# Patient Record
Sex: Male | Born: 1935 | Race: Black or African American | Hispanic: No | Marital: Married | Smoking: Former smoker
Health system: Southern US, Community
[De-identification: ages and names within clinical notes are randomized; demographics above are authoritative.]

## PROBLEM LIST (undated history)

## (undated) DIAGNOSIS — E039 Hypothyroidism, unspecified: Secondary | ICD-10-CM

## (undated) DIAGNOSIS — E785 Hyperlipidemia, unspecified: Secondary | ICD-10-CM

## (undated) HISTORY — DX: Hypothyroidism, unspecified: E03.9

## (undated) HISTORY — DX: Hyperlipidemia, unspecified: E78.5

---

## 1997-08-13 ENCOUNTER — Other Ambulatory Visit: Admission: RE | Admit: 1997-08-13 | Discharge: 1997-08-13 | Payer: Self-pay | Admitting: *Deleted

## 2002-10-31 ENCOUNTER — Encounter (HOSPITAL_BASED_OUTPATIENT_CLINIC_OR_DEPARTMENT_OTHER): Payer: Self-pay | Admitting: General Surgery

## 2002-11-05 ENCOUNTER — Ambulatory Visit (HOSPITAL_COMMUNITY): Admission: RE | Admit: 2002-11-05 | Discharge: 2002-11-05 | Payer: Self-pay | Admitting: General Surgery

## 2005-03-10 ENCOUNTER — Ambulatory Visit (HOSPITAL_COMMUNITY): Admission: RE | Admit: 2005-03-10 | Discharge: 2005-03-10 | Payer: Self-pay | Admitting: Gastroenterology

## 2005-03-10 ENCOUNTER — Encounter (INDEPENDENT_AMBULATORY_CARE_PROVIDER_SITE_OTHER): Payer: Self-pay | Admitting: *Deleted

## 2007-03-29 ENCOUNTER — Encounter (INDEPENDENT_AMBULATORY_CARE_PROVIDER_SITE_OTHER): Payer: Self-pay | Admitting: Urology

## 2007-03-29 ENCOUNTER — Ambulatory Visit (HOSPITAL_BASED_OUTPATIENT_CLINIC_OR_DEPARTMENT_OTHER): Admission: RE | Admit: 2007-03-29 | Discharge: 2007-03-29 | Payer: Self-pay | Admitting: Urology

## 2010-06-07 NOTE — Op Note (Signed)
NAME:  Samuel [name], Samuel [name] NO.:  [id number]   MEDICAL RECORD NO.:  [id number]          PATIENT TYPE:  AMB   LOCATION:  [facility]                         FACILITY:  [facility]   PHYSICIAN:  Lindaann Slough, M.D.  DATE OF BIRTH:  27-Feb-1935   DATE OF PROCEDURE:  03/29/2007  DATE OF DISCHARGE:                               OPERATIVE REPORT   PREOPERATIVE DIAGNOSIS:  Elevated PSA (prostate specific antigen), BPH  (benign prostatic hyperplasia).   POSTOPERATIVE DIAGNOSIS:  Elevated PSA (prostate specific antigen), BPH  (benign prostatic hyperplasia).   PROCEDURE:  Saturation prostate biopsy.   SURGEON:  Danae Chen, M.D.   ULTRASOUND TECHNOLOGIST:  Lanny Hurst.   ANESTHESIA:  General.   INDICATION:  Had three negative biopsies in the past.  His PSA has  continued to rise.  I discussed with the patient regarding doing a  saturation biopsy and he agreed to have it done.  He is scheduled today  for the procedure.   PROCEDURE IN DETAIL:  Under general anesthesia the patient was prepped  and draped and placed in the dorsal lithotomy position.  The patient was  identified by his wristband and a time-out was done.  Then the  transducer was inserted in the rectum and the grid was attached to the  transducer then 24 biopsies were done throughout the prostate gland  covering all the quadrants.  There was minimal bleeding.  The transducer  was then removed.   The patient tolerated the procedure well and left the OR in satisfactory  condition to post anesthesia care unit.      Lindaann Slough, M.D.  Electronically Signed     MN/MEDQ  D:  03/29/2007  T:  03/31/2007  Job:  [id number]

## 2010-06-10 NOTE — Op Note (Signed)
Samuel [name], Samuel [name] NO.:  [id number]   MEDICAL RECORD NO.:  [id number]                   PATIENT TYPE:  OIB   LOCATION:  2890                                 FACILITY:  [facility]   PHYSICIAN:  Leonie Man, M.D.                DATE OF BIRTH:  April 06, 1935   DATE OF PROCEDURE:  11/05/2002  DATE OF DISCHARGE:                                 OPERATIVE REPORT   PREOPERATIVE DIAGNOSIS:  Left inguinal hernia.   POSTOPERATIVE DIAGNOSIS:  Left direct inguinal hernia.   OPERATION PERFORMED:  Left inguinal herniorrhaphy with polypropylene mesh.   SURGEON:  Leonie Man, M.D.   ASSISTANT:  Nurse.   ANESTHESIA:  General.   INDICATIONS FOR PROCEDURE:  The patient is a 75 year old man who presents  with a left groin bulge that has been present for one and a half to two  years.  Recently, the hernia has prolapsed, been prolapsing frequently and  causing some significant discomfort.  He comes to the operating room now for  repair after the risks and potential benefits of surgery had been fully  discussed and all questions answered and consent obtained.   DESCRIPTION OF PROCEDURE:  Following the induction of satisfactory general  anesthesia, the patient was positioned supinely.  The lower abdomen was  prepped and draped to be included in a sterile operative field.  I  infiltrated the lower abdominal crease on the left side with 0.5% Marcaine  with epinephrine 1:200,000.  I then made a transverse incision, deepened  this through the skin and subcutaneous tissue carrying the dissection down  to the external oblique aponeurosis.  Additional injections of Marcaine with  epinephrine were used throughout the course of the operation.  External  oblique aponeurosis was opened up through the external inguinal ring with  protection of the ilioinguinal nerve which was retracted medially and  superiorly.  Spermatic cord was elevated and held with a Penrose drain.  Dissection along the anterior medial aspect of the spermatic cord and  exploration of the cord did not show an indirect hernia.  There was a very  large and direct hernia which was reduced back into the retroperitoneal  space and repaired with a fashioned strip of polypropylene mesh which was  sewn in at the pubic tubercle with a 2-0 Novofil suture and continued up  along the conjoined tendon to the internal ring and again from the pubic  tubercle along the shelving edge of Poupart's ligament to the internal ring.  The mesh was split so as to allow the normal protrusion of the cord.  The  tails of the mesh were then trimmed and sutured into the external oblique  muscle behind the cord.  The repair was checked and noted to be intact.  Sponge, instrument and sharp counts were then verified. The external oblique  aponeurosis was closed over the spermatic cord, thereby reapproximating the  external inguinal ring.  This was done with a 2-0 Vicryl suture.  The  Scarpa's fascia was then closed with a running 3-0 Vicryl suture  and the skin was closed with a running 4-0 Monocryl suture.  The wound was  reinforced with Steri-Strips.  Sterile dressing was applied.  Anesthetic was  reversed and the patient removed from the operating room to the recovery  room in stable condition, having tolerated the procedure well.                                                Leonie Man, M.D.    PB/MEDQ  D:  11/05/2002  T:  11/05/2002  Job:  [id number]

## 2010-10-17 LAB — POCT HEMOGLOBIN-HEMACUE
Hemoglobin: 13.6
Operator id: 268271

## 2012-09-25 ENCOUNTER — Ambulatory Visit
Admission: RE | Admit: 2012-09-25 | Discharge: 2012-09-25 | Disposition: A | Discharge status: Home / Self Care | Payer: BC Managed Care – PPO | Source: Ambulatory Visit | Attending: Nurse Practitioner | Admitting: Nurse Practitioner

## 2012-09-25 ENCOUNTER — Other Ambulatory Visit: Payer: Self-pay | Admitting: Nurse Practitioner

## 2012-09-25 DIAGNOSIS — M545 Low back pain, unspecified: Secondary | ICD-10-CM

## 2012-09-25 DIAGNOSIS — M25551 Pain in right hip: Secondary | ICD-10-CM

## 2014-02-10 IMAGING — CR DG LUMBAR SPINE COMPLETE 4+V
5 series · 5 of 5 positions shown · non-contrast
Comparison: None

CLINICAL DATA: Posterior right hip pain radiating down back of
right leg

LUMBAR SPINE - COMPLETE 4+ VIEW

[view not recorded (1 of 5)]
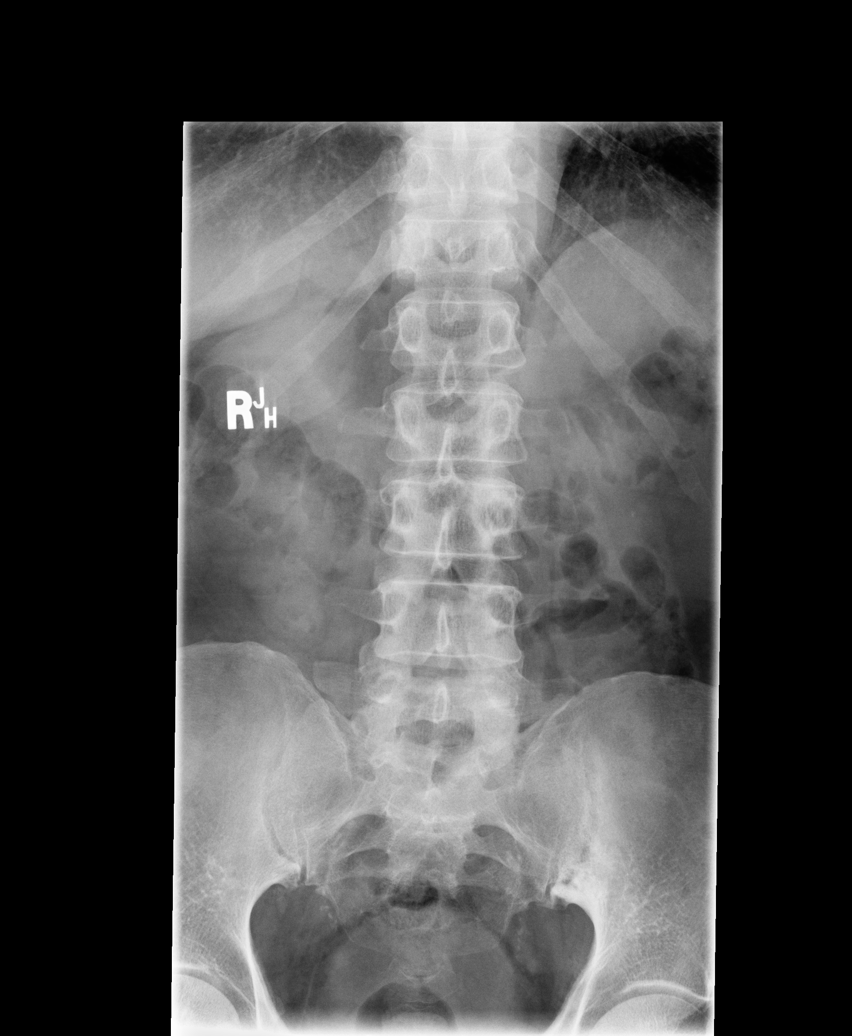

[view not recorded (2 of 5)]
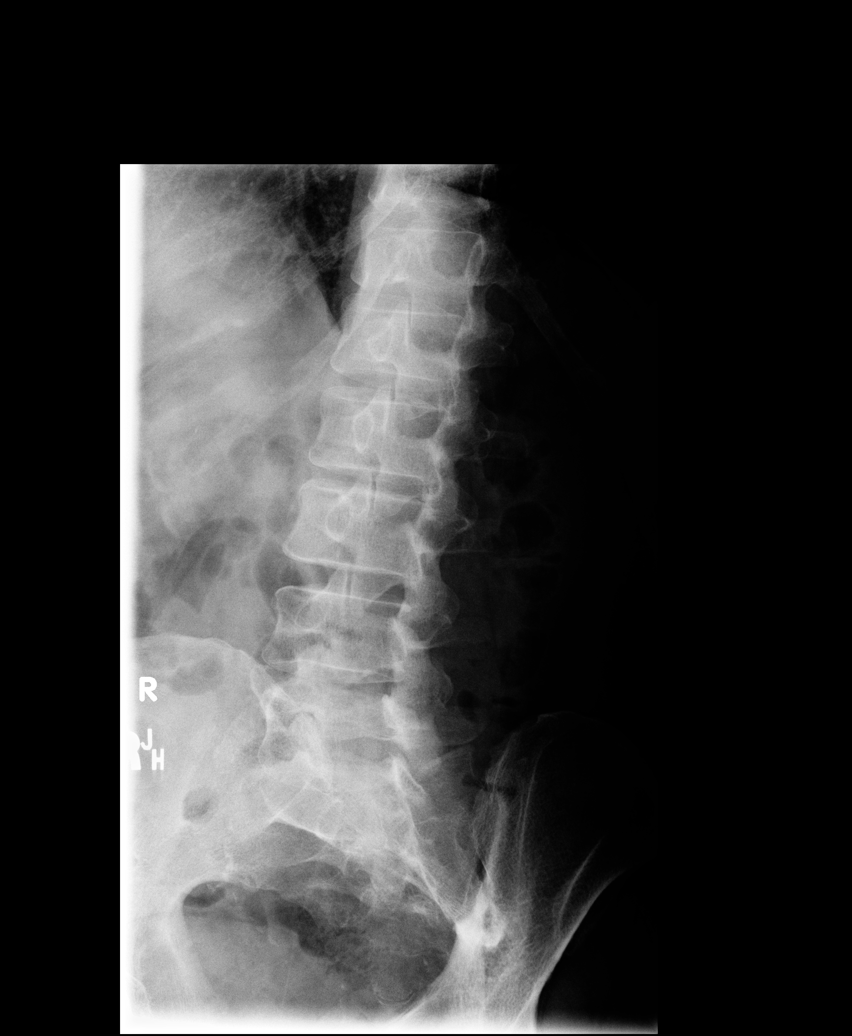

[view not recorded (3 of 5)]
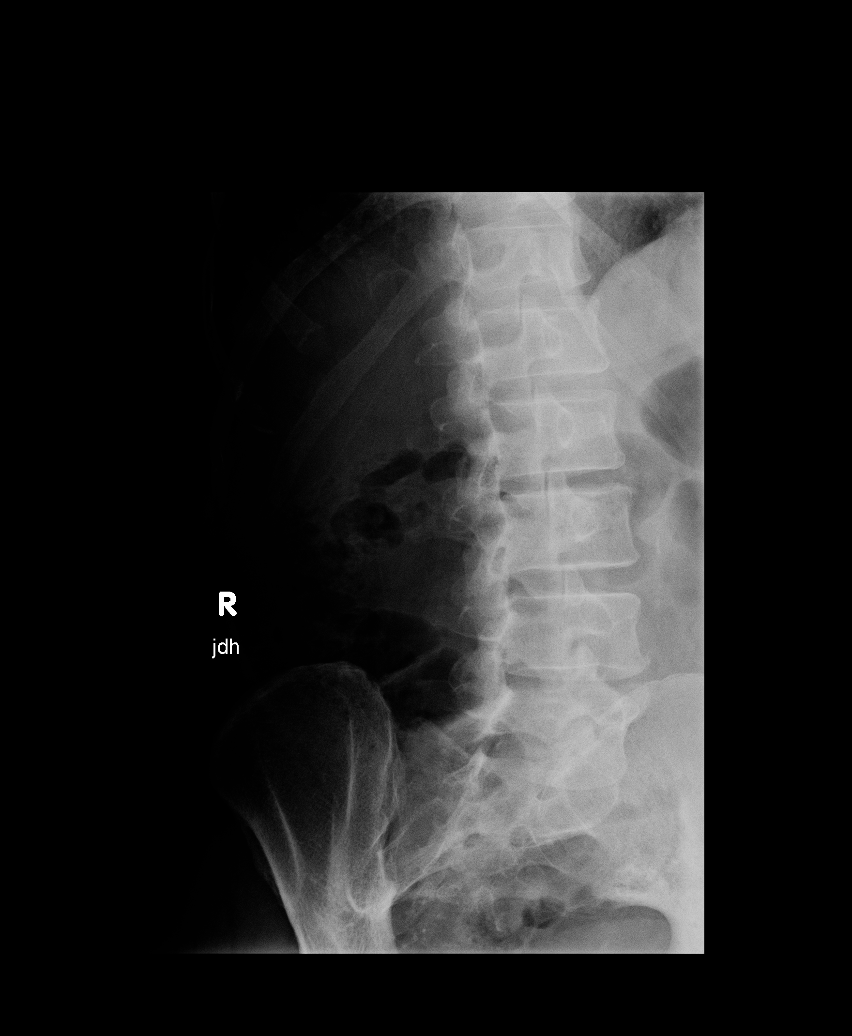

[view not recorded (4 of 5)]
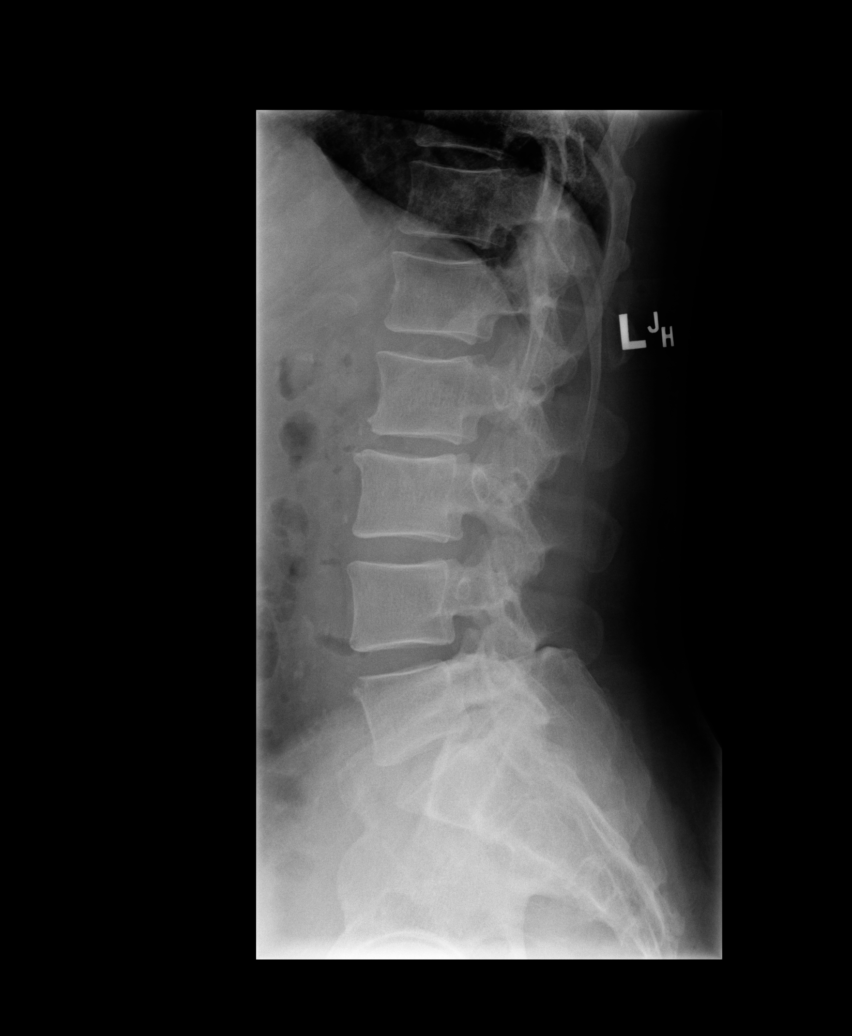

[view not recorded (5 of 5)]
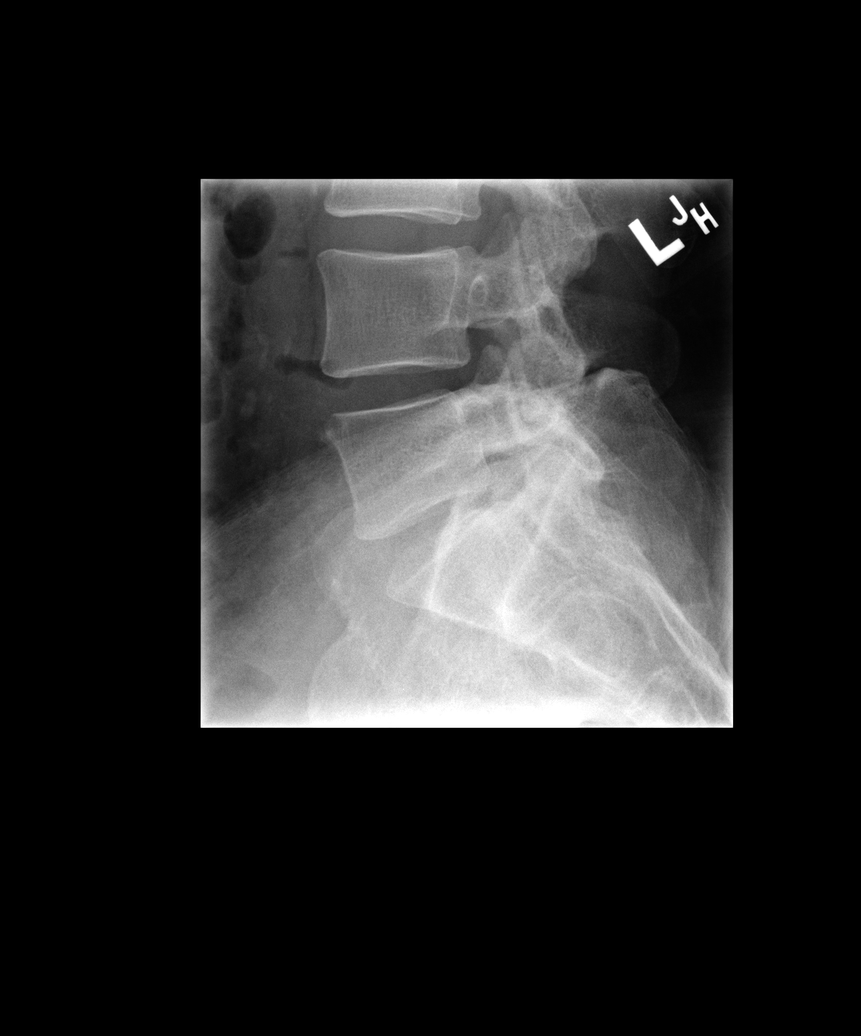

[5 of 5 positions shown; findings below may reference images not displayed]

FINDINGS: Five non-rib bearing lumbar vertebrae.
Osseous mineralization appears mildly decreased.
Slight disc space narrowing at L2-L3.
Vertebral body and disc space heights otherwise maintained.
No acute fracture, subluxation or bone destruction.
No spondylolysis.
IMPRESSION: No acute osseous abnormalities as above.

## 2015-12-27 ENCOUNTER — Ambulatory Visit
Admission: RE | Admit: 2015-12-27 | Discharge: 2015-12-27 | Disposition: A | Discharge status: Home / Self Care | Payer: Federal, State, Local not specified - PPO | Source: Ambulatory Visit | Attending: Nurse Practitioner | Admitting: Nurse Practitioner

## 2015-12-27 ENCOUNTER — Other Ambulatory Visit: Payer: Self-pay | Admitting: Nurse Practitioner

## 2015-12-27 DIAGNOSIS — M25512 Pain in left shoulder: Secondary | ICD-10-CM

## 2017-05-13 IMAGING — CR DG SHOULDER 2+V*L*
3 series · 3 of 3 positions shown · non-contrast
Comparison: None.

CLINICAL DATA: Left shoulder pain for 3 weeks.

EXAM:
LEFT SHOULDER - 2+ VIEW

[w shoulder grashey left]
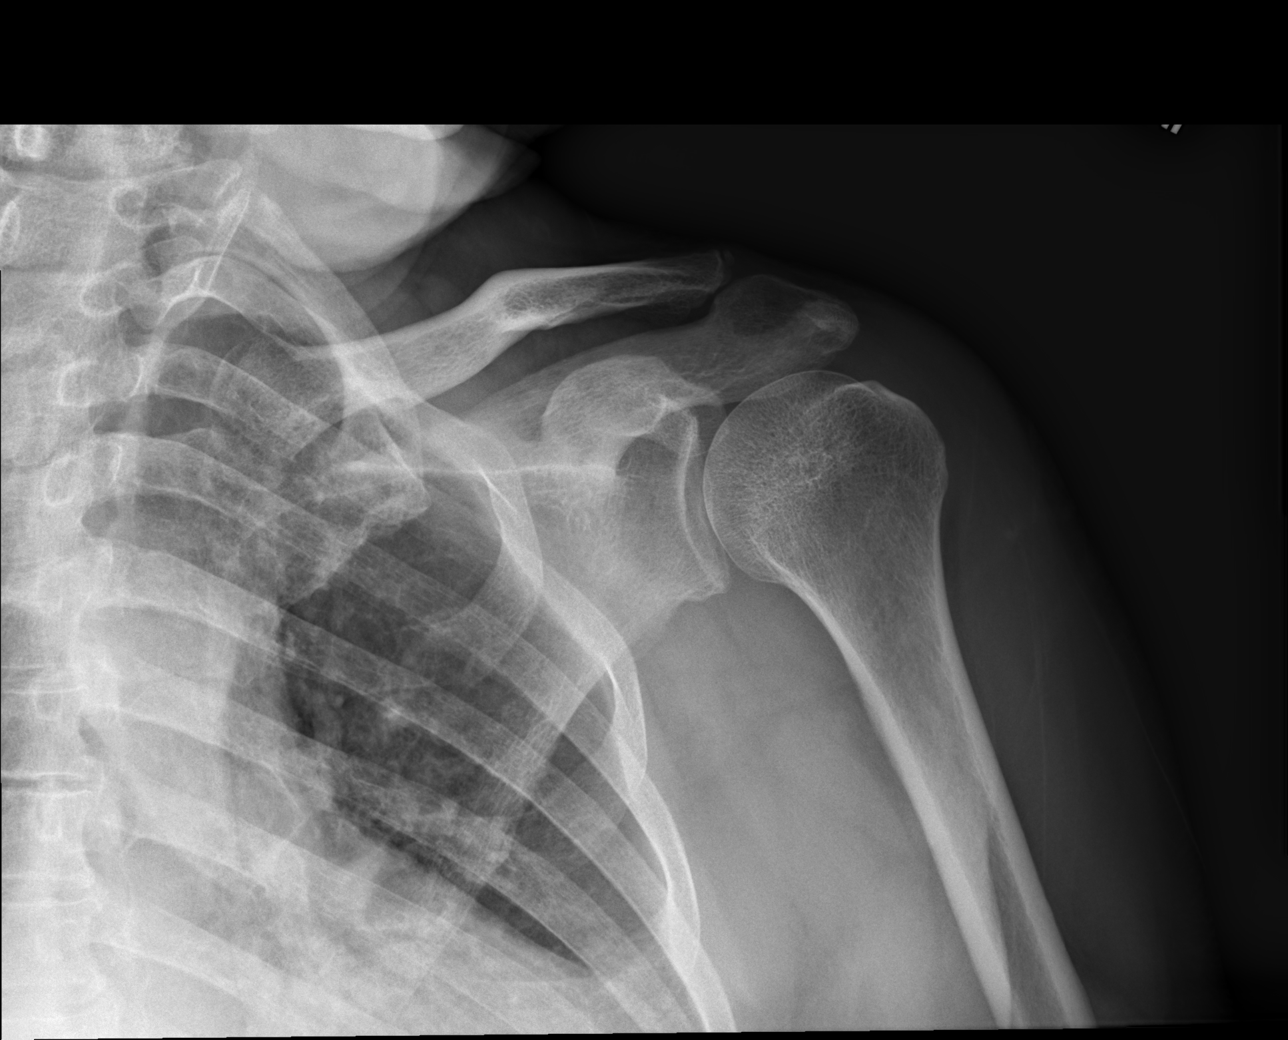

[w shoulder y-view left]
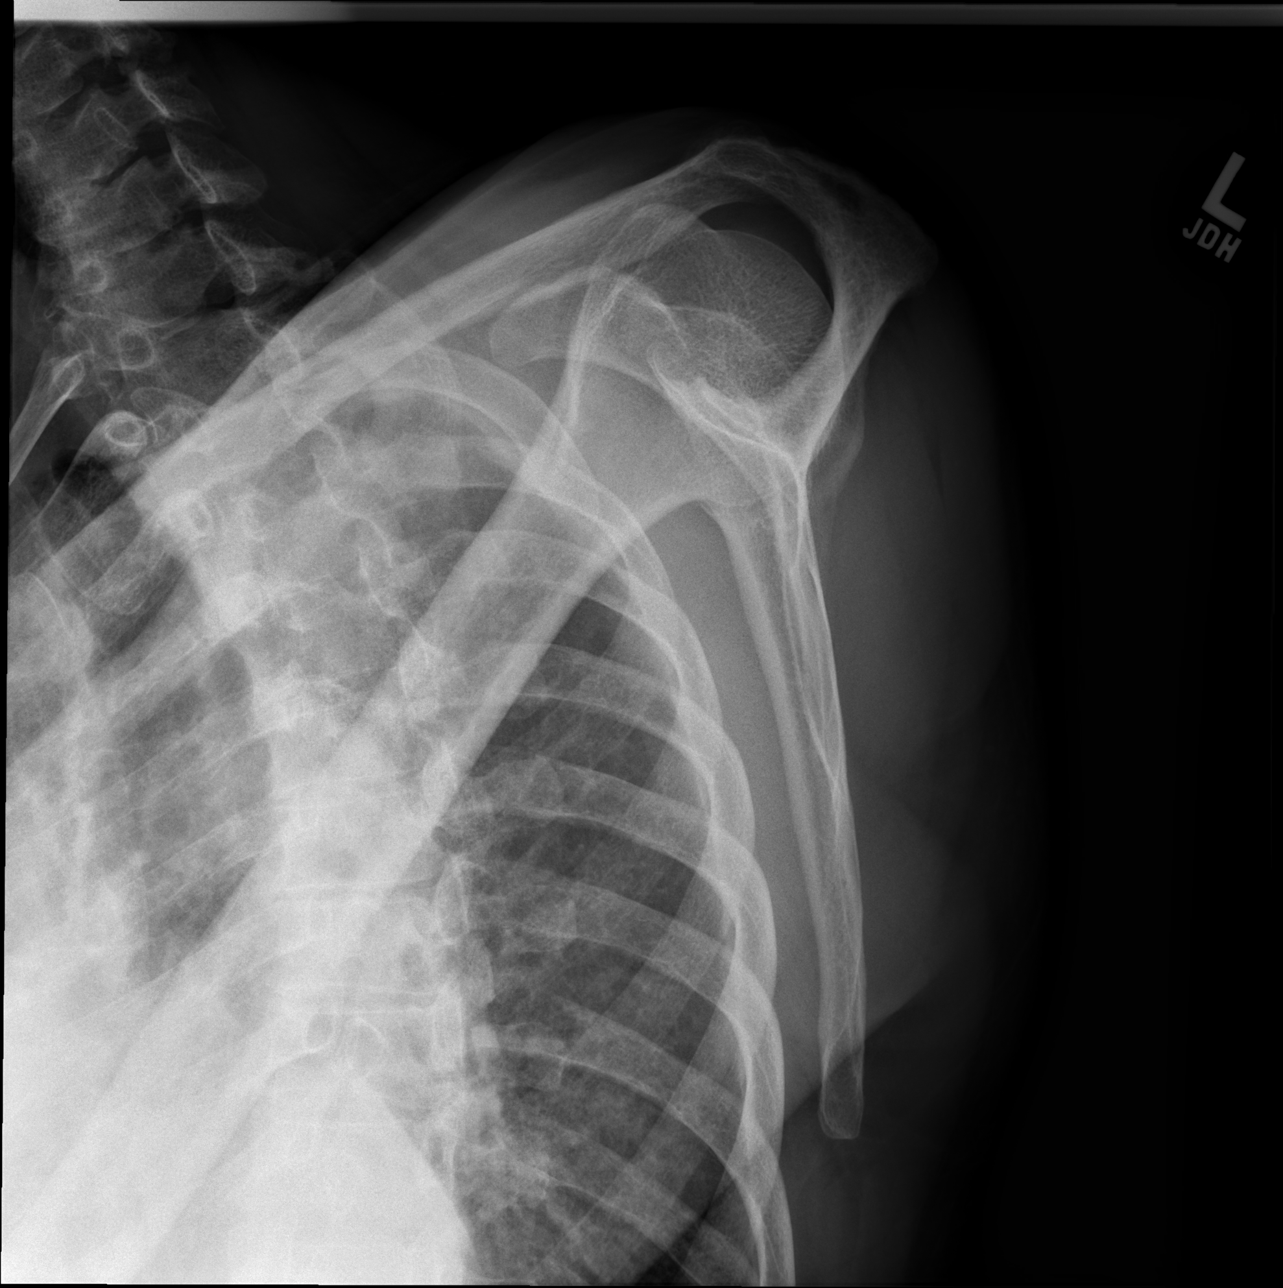

[w shoulder axillary left]
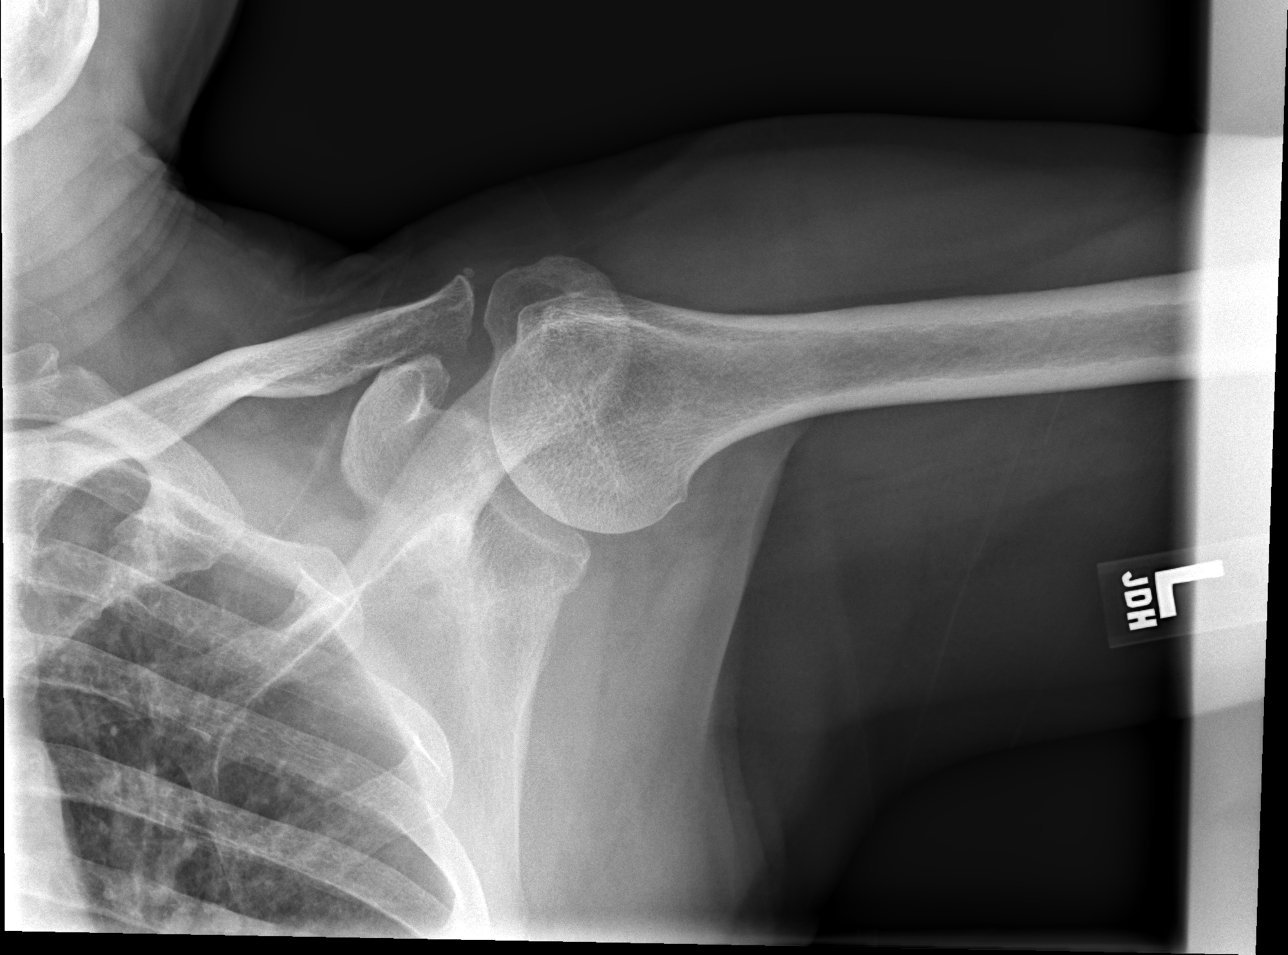

[3 of 3 positions shown; findings below may reference images not displayed]

FINDINGS: There is no evidence of fracture or dislocation. Mild
acromioclavicular joint osteoarthrosis is noted. No focal osseous
lesion or soft tissue abnormality is identified.
IMPRESSION: 1. No acute osseous abnormality.
2. Mild acromioclavicular osteoarthrosis.

## 2017-12-31 ENCOUNTER — Ambulatory Visit (INDEPENDENT_AMBULATORY_CARE_PROVIDER_SITE_OTHER): Payer: Federal, State, Local not specified - PPO | Admitting: Nurse Practitioner

## 2017-12-31 ENCOUNTER — Encounter: Payer: Self-pay | Admitting: Nurse Practitioner

## 2017-12-31 VITALS — BP 122/78 | HR 74 | Temp 97.9°F | Ht 64.5 in | Wt 167.2 lb

## 2017-12-31 DIAGNOSIS — E785 Hyperlipidemia, unspecified: Secondary | ICD-10-CM | POA: Insufficient documentation

## 2017-12-31 DIAGNOSIS — N400 Enlarged prostate without lower urinary tract symptoms: Secondary | ICD-10-CM | POA: Diagnosis not present

## 2017-12-31 DIAGNOSIS — E039 Hypothyroidism, unspecified: Secondary | ICD-10-CM

## 2017-12-31 DIAGNOSIS — Z0000 Encounter for general adult medical examination without abnormal findings: Secondary | ICD-10-CM | POA: Diagnosis not present

## 2017-12-31 DIAGNOSIS — E559 Vitamin D deficiency, unspecified: Secondary | ICD-10-CM | POA: Diagnosis not present

## 2017-12-31 LAB — POCT URINALYSIS DIPSTICK
Bilirubin, UA: NEGATIVE
GLUCOSE UA: NEGATIVE
Ketones, UA: NEGATIVE
LEUKOCYTES UA: NEGATIVE
NITRITE UA: NEGATIVE
PROTEIN UA: NEGATIVE
RBC UA: NEGATIVE
SPEC GRAV UA: 1.025 (ref 1.010–1.025)
Urobilinogen, UA: 0.2 E.U./dL
pH, UA: 5.5 (ref 5.0–8.0)

## 2017-12-31 NOTE — Patient Instructions (Signed)

## 2017-12-31 NOTE — Progress Notes (Signed)
Subjective:     Patient ID: Samuel [name] , male    DOB: Apr 11, 1935 , 82 y.o.   MRN: [id number]   Chief Complaint  Patient presents with  . Annual Exam    HPI  Here for his Health Maintenance   Men's preventive visit. Patient Health Questionnaire (PHQ-2) is    Office Visit from 12/31/2017 in [facility]  PHQ-2 Total Score  0    . Patient is on a Regular diet, tries to limit red meat. Marital status: Married.   Relevant history for alcohol use is:  Social History   Substance and Sexual Activity  Alcohol Use Yes   Comment: ocassionally  Relevant history for tobacco use is:   Social History   Tobacco Use  Smoking Status Former Smoker  Smokeless Tobacco Never Used    Exercises three times per week.  Past Medical History:  Diagnosis Date  . Hyperlipidemia   . Hypothyroid      No family history on file.   Current Outpatient Medications:  .  atorvastatin (LIPITOR) 20 MG tablet, Take 20 mg by mouth daily., Disp: , Rfl:  .  cholecalciferol (VITAMIN D3) 25 MCG (1000 UT) tablet, Take 1,000 Units by mouth daily. Take 3000 units daily, Disp: , Rfl:  .  dutasteride (AVODART) 0.5 MG capsule, Take 0.5 mg by mouth daily., Disp: , Rfl:  .  levothyroxine (SYNTHROID, LEVOTHROID) 100 MCG tablet, Take 100 mcg by mouth daily before breakfast., Disp: , Rfl:  .  tadalafil (CIALIS) 5 MG tablet, Take 5 mg by mouth daily as needed for erectile dysfunction., Disp: , Rfl:    No Known Allergies   Review of Systems  Constitutional: Negative.   HENT: Negative.   Eyes: Negative.   Respiratory: Negative.   Cardiovascular: Negative.   Gastrointestinal: Negative.   Endocrine: Negative.   Genitourinary: Negative.   Musculoskeletal: Negative.   Skin: Negative.   Allergic/Immunologic: Negative.   Neurological: Negative.   Hematological: Negative.   Psychiatric/Behavioral: Negative.      Today's Vitals   12/31/17 0926  BP: 122/78  Pulse: 74  Temp: 97.9 F  (36.6 C)  TempSrc: Oral  SpO2: 92%  Weight: 167 lb 3.2 oz (75.8 kg)  Height: 5' 4.5" (1.638 m)  PainSc: 0-No pain   Body mass index is 28.26 kg/m.   Objective:  Physical Exam  Constitutional: He is oriented to person, place, and time. He appears well-developed and well-nourished.  HENT:  Head: Normocephalic and atraumatic.  Right Ear: External ear normal.  Left Ear: External ear normal.  Nose: Nose normal.  Mouth/Throat: Oropharynx is clear and moist.  Eyes: Pupils are equal, round, and reactive to light. Conjunctivae and EOM are normal.  Neck: Normal range of motion. Neck supple.  Cardiovascular: Normal rate, regular rhythm and normal heart sounds.  No murmur heard. Pulmonary/Chest: Effort normal and breath sounds normal.  Abdominal: Soft. Bowel sounds are normal.  Musculoskeletal: Normal range of motion.  Neurological: He is alert and oriented to person, place, and time.  Skin: Skin is warm and dry. Capillary refill takes less than 2 seconds.  Vitals reviewed.       Assessment And Plan:   1. Encounter for general adult medical examination w/o abnormal findings . Behavior modifications discussed and diet history reviewed.   . Pt will continue to exercise regularly and modify diet with [name], plant based foods and decrease intake of processed foods.  . Recommend intake of daily multivitamin, Vitamin D, and  calcium.  . Recommend mammogram and colonoscopy for preventive screenings, as well as recommend immunizations that include influenza, TDAP, and Shingles - POCT Urinalysis Dipstick (81002)  2. Benign prostatic hyperplasia without lower urinary tract symptoms Chronic, controlled Continue with current medications  Being followed by Urology  3. Hyperlipidemia, unspecified hyperlipidemia type Chronic, controlled Continue with current medications - CMP14 + Anion Gap  4. Vitamin D deficiency  Chronic, controlled  Continue with current dose of vitamin d 3,000  units - Vitamin D (25 hydroxy)  5. Acquired hypothyroidism Chronic, controlled Continue with current medications - TSH - T3, free - T4      Arnette FeltsJanece Naliya Gish, FNP

## 2018-01-01 LAB — CMP14 + ANION GAP
ALT: 31 IU/L (ref 0–44)
AST: 22 IU/L (ref 0–40)
Albumin/Globulin Ratio: 1.7 (ref 1.2–2.2)
Albumin: 4.5 g/dL (ref 3.5–4.8)
Alkaline Phosphatase: 67 IU/L (ref 39–117)
Anion Gap: 14 mmol/L (ref 10.0–18.0)
BUN/Creatinine Ratio: 9 — ABNORMAL LOW (ref 10–24)
BUN: 11 mg/dL (ref 8–27)
Bilirubin Total: 0.8 mg/dL (ref 0.0–1.2)
CO2: 25 mmol/L (ref 20–29)
Calcium: 9.6 mg/dL (ref 8.6–10.2)
Chloride: 99 mmol/L (ref 96–106)
Creatinine, Ser: 1.17 mg/dL (ref 0.76–1.27)
GFR calc Af Amer: 68 mL/min/{1.73_m2} (ref >59)
GFR calc non Af Amer: 59 mL/min/{1.73_m2} — ABNORMAL LOW (ref >59)
Globulin, Total: 2.6 g/dL (ref 1.5–4.5)
Glucose: 115 mg/dL — ABNORMAL HIGH (ref 65–99)
Potassium: 3.6 mmol/L (ref 3.5–5.2)
Sodium: 138 mmol/L (ref 134–144)
Total Protein: 7.1 g/dL (ref 6.0–8.5)

## 2018-01-01 LAB — T4: T4, Total: 9.5 ug/dL (ref 4.5–12.0)

## 2018-01-01 LAB — VITAMIN D 25 HYDROXY (VIT D DEFICIENCY, FRACTURES): Vit D, 25-Hydroxy: 47.9 ng/mL (ref 30.0–100.0)

## 2018-01-01 LAB — TSH: TSH: 1.97 u[IU]/mL (ref 0.450–4.500)

## 2018-01-01 LAB — T3, FREE: T3, Free: 2.5 pg/mL (ref 2.0–4.4)

## 2018-02-21 ENCOUNTER — Telehealth: Payer: Self-pay

## 2018-02-21 NOTE — Telephone Encounter (Signed)
I called and spoke with pt due to his ins stating he is not filling his medicine as he should. he stated he is taking his atorvastatin as directed.YRL,RMA

## 2018-03-24 ENCOUNTER — Other Ambulatory Visit: Payer: Self-pay | Admitting: Nurse Practitioner

## 2018-06-24 ENCOUNTER — Other Ambulatory Visit: Payer: Self-pay | Admitting: Nurse Practitioner

## 2018-07-03 ENCOUNTER — Encounter: Payer: Self-pay | Admitting: Nurse Practitioner

## 2018-07-03 ENCOUNTER — Ambulatory Visit (INDEPENDENT_AMBULATORY_CARE_PROVIDER_SITE_OTHER): Payer: Federal, State, Local not specified - PPO | Admitting: Nurse Practitioner

## 2018-07-03 ENCOUNTER — Other Ambulatory Visit: Payer: Self-pay

## 2018-07-03 VITALS — BP 118/70 | HR 65 | Temp 98.0°F | Ht 64.6 in | Wt 170.6 lb

## 2018-07-03 DIAGNOSIS — E039 Hypothyroidism, unspecified: Secondary | ICD-10-CM | POA: Diagnosis not present

## 2018-07-03 DIAGNOSIS — E559 Vitamin D deficiency, unspecified: Secondary | ICD-10-CM | POA: Diagnosis not present

## 2018-07-03 DIAGNOSIS — E785 Hyperlipidemia, unspecified: Secondary | ICD-10-CM

## 2018-07-03 NOTE — Progress Notes (Signed)
Subjective:     Patient ID: Samuel [name] , male    DOB: 1935-04-22 , 83 y.o.   MRN: [id number]   Chief Complaint  Patient presents with  . Hypothyroidism    HPI  Hyperlipidemia - doing well without any complaints.    Thyroid Problem  Presents for follow-up visit. Patient reports no anxiety, fatigue or palpitations. The symptoms have been stable.     Past Medical History:  Diagnosis Date  . Hyperlipidemia   . Hypothyroid      No family history on file.   Current Outpatient Medications:  .  atorvastatin (LIPITOR) 20 MG tablet, Take 20 mg by mouth daily., Disp: , Rfl:  .  cholecalciferol (VITAMIN D3) 25 MCG (1000 UT) tablet, Take 1,000 Units by mouth daily. Take 3000 units daily, Disp: , Rfl:  .  dutasteride (AVODART) 0.5 MG capsule, Take 0.5 mg by mouth daily., Disp: , Rfl:  .  levothyroxine (SYNTHROID) 100 MCG tablet, TAKE 1 TABLET BY MOUTH EVERY DAY, Disp: 90 tablet, Rfl: 0 .  tadalafil (CIALIS) 5 MG tablet, Take 5 mg by mouth daily as needed for erectile dysfunction., Disp: , Rfl:    No Known Allergies   Review of Systems  Constitutional: Negative for fatigue.  Eyes: Negative for photophobia.  Respiratory: Negative.   Cardiovascular: Negative for chest pain, palpitations and leg swelling.  Endocrine: Negative for polydipsia, polyphagia and polyuria.  Musculoskeletal: Negative.   Neurological: Negative for dizziness and headaches.  Hematological: Negative.  Negative for adenopathy. Does not bruise/bleed easily.  Psychiatric/Behavioral: The patient is not nervous/anxious.      Today's Vitals   07/03/18 0853  BP: 118/70  Pulse: 65  Temp: 98 F (36.7 C)  TempSrc: Oral  Weight: 170 lb 9.6 oz (77.4 kg)  Height: 5' 4.6" (1.641 m)  PainSc: 0-No pain   Body mass index is 28.74 kg/m.   Objective:  Physical Exam Vitals signs reviewed.  Constitutional:      Appearance: Normal appearance.  Cardiovascular:     Rate and Rhythm: Normal rate and regular rhythm.     Pulses: Normal pulses.     Heart sounds: Normal heart sounds. No murmur.  Pulmonary:     Effort: Pulmonary effort is normal.     Breath sounds: Normal breath sounds.  Skin:    General: Skin is warm and dry.     Capillary Refill: Capillary refill takes less than 2 seconds.  Neurological:     General: No focal deficit present.     Mental Status: He is alert and oriented to person, place, and time.  Psychiatric:        Mood and Affect: Mood normal.        Behavior: Behavior normal.        Thought Content: Thought content normal.        Judgment: Judgment normal.         Assessment And Plan:    1. Acquired hypothyroidism  Chronic, controlled  Continue with current medications - TSH - T4 - T3, free  2. Hyperlipidemia, unspecified hyperlipidemia type  Chronic, controlled  Continue with current medications  Continue to limit intake of fried and fatty foods. - Lipid Profile  3. Vitamin D deficiency  Will check vitamin D level and supplement as needed.     Also encouraged to spend 15 minutes in the sun daily.  - Vitamin D (25 hydroxy)     [name], FNP    THE PATIENT IS ENCOURAGED TO  PRACTICE SOCIAL DISTANCING DUE TO THE COVID-19 PANDEMIC.

## 2018-07-04 LAB — LIPID PANEL
Chol/HDL Ratio: 4.6 ratio (ref 0.0–5.0)
Cholesterol, Total: 171 mg/dL (ref 100–199)
HDL: 37 mg/dL — ABNORMAL LOW (ref >39)
LDL Calculated: 108 mg/dL — ABNORMAL HIGH (ref 0–99)
Triglycerides: 128 mg/dL (ref 0–149)
VLDL Cholesterol Cal: 26 mg/dL (ref 5–40)

## 2018-07-04 LAB — VITAMIN D 25 HYDROXY (VIT D DEFICIENCY, FRACTURES): Vit D, 25-Hydroxy: 41.7 ng/mL (ref 30.0–100.0)

## 2018-07-04 LAB — T3, FREE: T3, Free: 2.3 pg/mL (ref 2.0–4.4)

## 2018-07-04 LAB — TSH: TSH: 3.83 u[IU]/mL (ref 0.450–4.500)

## 2018-07-04 LAB — T4: T4, Total: 8.9 ug/dL (ref 4.5–12.0)

## 2018-07-20 ENCOUNTER — Other Ambulatory Visit: Payer: Self-pay | Admitting: Nurse Practitioner

## 2018-09-22 ENCOUNTER — Other Ambulatory Visit: Payer: Self-pay | Admitting: Nurse Practitioner

## 2018-12-05 ENCOUNTER — Encounter: Payer: Self-pay | Admitting: Nurse Practitioner

## 2019-01-07 ENCOUNTER — Other Ambulatory Visit: Payer: Self-pay | Admitting: Nurse Practitioner

## 2019-01-09 ENCOUNTER — Encounter: Payer: Self-pay | Admitting: Internal Medicine

## 2019-01-09 ENCOUNTER — Other Ambulatory Visit: Payer: Self-pay

## 2019-01-09 ENCOUNTER — Ambulatory Visit (INDEPENDENT_AMBULATORY_CARE_PROVIDER_SITE_OTHER): Payer: Federal, State, Local not specified - PPO | Admitting: Internal Medicine

## 2019-01-09 VITALS — BP 118/72 | HR 68 | Temp 98.1°F | Ht 65.0 in | Wt 173.2 lb

## 2019-01-09 DIAGNOSIS — Z0000 Encounter for general adult medical examination without abnormal findings: Secondary | ICD-10-CM

## 2019-01-09 DIAGNOSIS — E039 Hypothyroidism, unspecified: Secondary | ICD-10-CM

## 2019-01-09 DIAGNOSIS — E785 Hyperlipidemia, unspecified: Secondary | ICD-10-CM

## 2019-01-09 LAB — POCT URINALYSIS DIPSTICK
Bilirubin, UA: NEGATIVE
Blood, UA: NEGATIVE
Glucose, UA: NEGATIVE
Ketones, UA: NEGATIVE
Leukocytes, UA: NEGATIVE
Nitrite, UA: NEGATIVE
Protein, UA: NEGATIVE
Spec Grav, UA: >=1.03 — AB (ref 1.010–1.025)
Urobilinogen, UA: 0.2 E.U./dL
pH, UA: 5.5 (ref 5.0–8.0)

## 2019-01-09 NOTE — Progress Notes (Signed)
This visit occurred during the SARS-CoV-2 public health emergency.  Safety protocols were in place, including screening questions prior to the visit, additional usage of staff PPE, and extensive cleaning of exam room while observing appropriate contact time as indicated for disinfecting solutions.  Subjective:     Patient ID: Samuel [name] , male    DOB: [birth year] , 83 y.o.   MRN: [id number]   Chief Complaint  Patient presents with  . Annual Exam    HPI  Pt is here for yearly physical. Walks 2 miles 3x/week. His urologist does his prostate and rectal exam.   Past Medical History:  Diagnosis Date  . Hyperlipidemia   . Hypothyroid      No family history on file.   Current Outpatient Medications:  .  atorvastatin (LIPITOR) 20 MG tablet, TAKE 1 TABLET BY MOUTH EVERY DAY, Disp: 90 tablet, Rfl: 1 .  cholecalciferol (VITAMIN D3) 25 MCG (1000 UT) tablet, Take 1,000 Units by mouth daily. Take 3000 units daily, Disp: , Rfl:  .  dutasteride (AVODART) 0.5 MG capsule, Take 0.5 mg by mouth daily., Disp: , Rfl:  .  levothyroxine (SYNTHROID) 100 MCG tablet, TAKE 1 TABLET BY MOUTH EVERY DAY, Disp: 90 tablet, Rfl: 0 .  tadalafil (CIALIS) 5 MG tablet, Take 5 mg by mouth daily as needed for erectile dysfunction., Disp: , Rfl:    No Known Allergies   Review of Systems  Gets minor aches from arthritis, otherwise denies the rest of 10 point ROS Today's Vitals   01/09/19 0854  BP: 118/72  Pulse: 68  Temp: 98.1 F (36.7 C)  TempSrc: Oral  Weight: 173 lb 3.2 oz (78.6 kg)  Height: 5\' 5"  (1.651 m)   Body mass index is 28.82 kg/m.   Objective:  BP 118/72 (BP Location: Left Arm, Patient Position: Sitting, Cuff Size: Normal)   Pulse 68   Temp 98.1 F (36.7 C) (Oral)   Ht 5\' 5"  (1.651 m)   Wt 173 lb 3.2 oz (78.6 kg)   BMI 28.82 kg/m   General Appearance:    Alert, cooperative, no distress, appears stated age  Head:    Normocephalic, without obvious abnormality, atraumatic  Eyes:    PERRL,  conjunctiva/corneas clear, EOM's intact  Ears:    Normal TM's and external ear canals, both ears  Nose:   Nares normal, septum midline, mucosa normal, no drainage   or sinus tenderness  Throat:   Lips, mucosa, and tongue normal; teeth and gums normal  Neck:   Supple, symmetrical, trachea midline, no adenopathy;       thyroid:  No enlargement/tenderness/nodules; no carotid   bruit  Back:     Symmetric, no curvature, ROM normal, no CVA tenderness  Lungs:     Clear to auscultation bilaterally, respirations unlabored  Chest wall:    No tenderness or deformity  Heart:    Regular rate and rhythm, S1 and S2 normal, no murmur, rub   or gallop  Abdomen:     Soft, non-tender, bowel sounds active all four quadrants,    no masses, no organomegaly  Genitalia:  declined  Rectal:    declined  Extremities:   Extremities normal, atraumatic, no cyanosis or edema  Pulses:   2+ and symmetric all extremities  Skin:   Skin color, texture, turgor normal, no rashes or lesions  Lymph nodes:   Cervical, supraclavicular, and axillary nodes normal  Neurologic:   CNII-XII intact. Has symmetric strength of upper and lower extremities. Normal strength,  sensation and reflexes      Throughout. Normal Romberg, tandem gait, finger to nose, heel and tip toe gait.        EKG- normal.  Assessment And Plan:    1. Routine general medical examination at a health care facility- Routine. FU in 1y - POCT Urinalysis Dipstick (81002) - EKG 12-Lead - CMP14 + Anion Gap - CBC no Diff  2. Hyperlipidemia, unspecified hyperlipidemia type- chronic. May continue current med.  - Lipid Profile  3. Acquired hypothyroidism- chronic. May continue current med unless his test are abnormal.  - TSH - T3, free - T4, free    Samuel Gwynne RODRIGUEZ-SOUTHWORTH, PA-C    THE PATIENT IS ENCOURAGED TO PRACTICE SOCIAL DISTANCING DUE TO THE COVID-19 PANDEMIC.

## 2019-01-09 NOTE — Patient Instructions (Signed)

## 2019-01-10 LAB — CBC
Hematocrit: 39.7 % (ref 37.5–51.0)
Hemoglobin: 13.4 g/dL (ref 13.0–17.7)
MCH: 32.4 pg (ref 26.6–33.0)
MCHC: 33.8 g/dL (ref 31.5–35.7)
MCV: 96 fL (ref 79–97)
Platelets: 198 10*3/uL (ref 150–450)
RBC: 4.13 x10E6/uL — ABNORMAL LOW (ref 4.14–5.80)
RDW: 11 % — ABNORMAL LOW (ref 11.6–15.4)
WBC: 6.3 10*3/uL (ref 3.4–10.8)

## 2019-01-10 LAB — CMP14 + ANION GAP
ALT: 35 IU/L (ref 0–44)
AST: 27 IU/L (ref 0–40)
Albumin/Globulin Ratio: 1.9 (ref 1.2–2.2)
Albumin: 4.4 g/dL (ref 3.7–4.7)
Alkaline Phosphatase: 75 IU/L (ref 39–117)
Anion Gap: 13 mmol/L (ref 10.0–18.0)
BUN/Creatinine Ratio: 9 — ABNORMAL LOW (ref 10–24)
BUN: 11 mg/dL (ref 8–27)
Bilirubin Total: 0.5 mg/dL (ref 0.0–1.2)
CO2: 27 mmol/L (ref 20–29)
Calcium: 9.5 mg/dL (ref 8.6–10.2)
Chloride: 101 mmol/L (ref 96–106)
Creatinine, Ser: 1.17 mg/dL (ref 0.76–1.27)
GFR calc Af Amer: 68 mL/min/{1.73_m2} (ref >59)
GFR calc non Af Amer: 59 mL/min/{1.73_m2} — ABNORMAL LOW (ref >59)
Globulin, Total: 2.3 g/dL (ref 1.5–4.5)
Glucose: 109 mg/dL — ABNORMAL HIGH (ref 65–99)
Potassium: 4 mmol/L (ref 3.5–5.2)
Sodium: 141 mmol/L (ref 134–144)
Total Protein: 6.7 g/dL (ref 6.0–8.5)

## 2019-01-10 LAB — LIPID PANEL
Chol/HDL Ratio: 3.9 ratio (ref 0.0–5.0)
Cholesterol, Total: 171 mg/dL (ref 100–199)
HDL: 44 mg/dL (ref >39)
LDL Chol Calc (NIH): 102 mg/dL — ABNORMAL HIGH (ref 0–99)
Triglycerides: 140 mg/dL (ref 0–149)
VLDL Cholesterol Cal: 25 mg/dL (ref 5–40)

## 2019-01-10 LAB — T3, FREE: T3, Free: 2.9 pg/mL (ref 2.0–4.4)

## 2019-01-10 LAB — TSH: TSH: 1.59 u[IU]/mL (ref 0.450–4.500)

## 2019-01-10 LAB — T4, FREE: Free T4: 1.36 ng/dL (ref 0.82–1.77)

## 2019-01-11 ENCOUNTER — Other Ambulatory Visit: Payer: Self-pay | Admitting: Nurse Practitioner

## 2019-01-15 ENCOUNTER — Telehealth: Payer: Self-pay

## 2019-01-15 NOTE — Telephone Encounter (Signed)
LVM for pt to call the office to receive message  Samuel [name], Samuel [name]  Samuel [name] T, CMA  Please call pt and inform him that his chemistry, cell count and thyroid test are normal. His bad cholesterol is a little elevated to 102, normal is 99 or less. Needs to work on avoiding fried foods, and increase eating more fiber, onions, garlic, oats.

## 2019-02-16 ENCOUNTER — Ambulatory Visit: Payer: Federal, State, Local not specified - PPO | Attending: Internal Medicine

## 2019-02-16 DIAGNOSIS — Z23 Encounter for immunization: Secondary | ICD-10-CM | POA: Insufficient documentation

## 2019-02-17 NOTE — Progress Notes (Signed)
   Covid-19 Vaccination Clinic  Name:  Samuel [name]    MRN: [id number] DOB: October 11, 1935  02/16/2019  [name] was observed post Covid-19 immunization for 15 minutes without incidence. He was provided with Vaccine Information Sheet and instruction to access the V-Safe system.   [name] was instructed to call 911 with any severe reactions post vaccine: Marland Kitchen Difficulty breathing  . Swelling of your face and throat  . A fast heartbeat  . A bad rash all over your body  . Dizziness and weakness    Immunizations Administered    Name Date Dose VIS Date Route   Moderna COVID-19 Vaccine 02/16/2019  3:45 PM 0.5 mL 12/24/2018 Intramuscular   Manufacturer: Gala Murdoch   Lot: [id number]   NDC: 80221-798-10      Documented on behalf of: P. Delford Field

## 2019-03-16 ENCOUNTER — Ambulatory Visit: Payer: Federal, State, Local not specified - PPO | Attending: Internal Medicine

## 2019-03-16 DIAGNOSIS — Z23 Encounter for immunization: Secondary | ICD-10-CM

## 2019-03-16 NOTE — Progress Notes (Signed)
   Covid-19 Vaccination Clinic  Name:  Samuel [name]    MRN: [id number] DOB: 1935/04/25  03/16/2019  Mr. Mathies was observed post Covid-19 immunization for 15 minutes without incidence. He was provided with Vaccine Information Sheet and instruction to access the V-Safe system.   Mr. Filip was instructed to call 911 with any severe reactions post vaccine: Marland Kitchen Difficulty breathing  . Swelling of your face and throat  . A fast heartbeat  . A bad rash all over your body  . Dizziness and weakness    Immunizations Administered    Name Date Dose VIS Date Route   Moderna COVID-19 Vaccine 03/16/2019  3:40 PM 0.5 mL 12/24/2018 Intramuscular   Manufacturer: Moderna   Lot: [id number]   NDC: 87867-672-09

## 2019-03-29 ENCOUNTER — Other Ambulatory Visit: Payer: Self-pay | Admitting: Nurse Practitioner

## 2019-04-09 ENCOUNTER — Other Ambulatory Visit: Payer: Self-pay | Admitting: Nurse Practitioner

## 2019-05-05 ENCOUNTER — Other Ambulatory Visit: Payer: Self-pay | Admitting: Nurse Practitioner

## 2019-06-16 ENCOUNTER — Encounter: Payer: Self-pay | Admitting: Nurse Practitioner

## 2019-06-16 ENCOUNTER — Ambulatory Visit (INDEPENDENT_AMBULATORY_CARE_PROVIDER_SITE_OTHER): Payer: Federal, State, Local not specified - PPO | Admitting: Nurse Practitioner

## 2019-06-16 ENCOUNTER — Other Ambulatory Visit: Payer: Self-pay

## 2019-06-16 VITALS — BP 119/80 | HR 66 | Temp 97.6°F | Ht 65.0 in | Wt 169.0 lb

## 2019-06-16 DIAGNOSIS — E039 Hypothyroidism, unspecified: Secondary | ICD-10-CM | POA: Diagnosis not present

## 2019-06-16 DIAGNOSIS — E785 Hyperlipidemia, unspecified: Secondary | ICD-10-CM | POA: Diagnosis not present

## 2019-06-16 MED ORDER — ATORVASTATIN CALCIUM 20 MG PO TABS: 20.0000 mg | ORAL_TABLET | Freq: Every day | ORAL | 1 refills | Status: DC

## 2019-06-16 NOTE — Progress Notes (Signed)
This visit occurred during the SARS-CoV-2 public health emergency.  Safety protocols were in place, including screening questions prior to the visit, additional usage of staff PPE, and extensive cleaning of exam room while observing appropriate contact time as indicated for disinfecting solutions.  Subjective:     Patient ID: Samuel [name] , male    DOB: 09/24/1935 , 84 y.o.   MRN: [id number]   Chief Complaint  Patient presents with  . Hyperlipidemia    wants to change medication    HPI  He took one pill of the simvistatin and got sick.  He also had eaten a chicken sandwich the same day. Has not taken the medication since 2 months.   He has had his covid vaccine and did well without any problems. Thyroid Problem Presents for follow-up visit. Patient reports no palpitations. The symptoms have been stable.     Past Medical History:  Diagnosis Date  . Hyperlipidemia   . Hypothyroid      History reviewed. No pertinent family history.   Current Outpatient Medications:  .  cholecalciferol (VITAMIN D3) 25 MCG (1000 UT) tablet, Take 1,000 Units by mouth daily. Take 3000 units daily, Disp: , Rfl:  .  dutasteride (AVODART) 0.5 MG capsule, Take 0.5 mg by mouth daily., Disp: , Rfl:  .  levothyroxine (SYNTHROID) 100 MCG tablet, TAKE 1 TABLET BY MOUTH EVERY DAY, Disp: 90 tablet, Rfl: 0 .  tadalafil (CIALIS) 5 MG tablet, Take 5 mg by mouth daily as needed for erectile dysfunction., Disp: , Rfl:  .  simvastatin (ZOCOR) 40 MG tablet, TAKE 1 TABLET EVERY DAY (Patient not taking: Reported on 06/16/2019), Disp: 90 tablet, Rfl: 0   No Known Allergies   Review of Systems  Constitutional: Negative.   Respiratory: Negative.   Cardiovascular: Negative.  Negative for chest pain, palpitations and leg swelling.  Neurological: Negative for dizziness and headaches.  Psychiatric/Behavioral: Negative.      Today's Vitals   06/16/19 0948  BP: 119/80  Pulse: 66  Temp: 97.6 F (36.4 C)  Weight: 169  lb (76.7 kg)  Height: '5\' 5"'$  (1.651 m)   Body mass index is 28.12 kg/m.   Objective:  Physical Exam Vitals reviewed.  Constitutional:      General: He is not in acute distress.    Appearance: Normal appearance.  Cardiovascular:     Rate and Rhythm: Normal rate and regular rhythm.     Pulses: Normal pulses.     Heart sounds: Normal heart sounds. No murmur.  Pulmonary:     Effort: Pulmonary effort is normal. No respiratory distress.     Breath sounds: Normal breath sounds.  Skin:    Capillary Refill: Capillary refill takes less than 2 seconds.  Neurological:     General: No focal deficit present.     Mental Status: He is alert and oriented to person, place, and time.     Cranial Nerves: No cranial nerve deficit.  Psychiatric:        Mood and Affect: Mood normal.        Behavior: Behavior normal.        Thought Content: Thought content normal.        Judgment: Judgment normal.         Assessment And Plan:     1. Hyperlipidemia, unspecified hyperlipidemia type  Chronic, stable but he did not tolerate simvistatin well will change back to atorvastatin - Lipid panel - CMP14+EGFR - atorvastatin (LIPITOR) 20 MG tablet; Take 1 tablet (  20 mg total) by mouth daily.  Dispense: 90 tablet; Refill: 1  2. Acquired hypothyroidism  Chronic, stable  Continue medications pending labs - T3, free - T4 - TSH      [name], FNP    THE PATIENT IS ENCOURAGED TO PRACTICE SOCIAL DISTANCING DUE TO THE COVID-19 PANDEMIC.

## 2019-06-17 LAB — CMP14+EGFR
ALT: 18 IU/L (ref 0–44)
AST: 19 IU/L (ref 0–40)
Albumin/Globulin Ratio: 1.6 (ref 1.2–2.2)
Albumin: 4.2 g/dL (ref 3.6–4.6)
Alkaline Phosphatase: 64 IU/L (ref 48–121)
BUN/Creatinine Ratio: 10 (ref 10–24)
BUN: 11 mg/dL (ref 8–27)
Bilirubin Total: 0.5 mg/dL (ref 0.0–1.2)
CO2: 24 mmol/L (ref 20–29)
Calcium: 9.3 mg/dL (ref 8.6–10.2)
Chloride: 100 mmol/L (ref 96–106)
Creatinine, Ser: 1.15 mg/dL (ref 0.76–1.27)
GFR calc Af Amer: 69 mL/min/{1.73_m2} (ref >59)
GFR calc non Af Amer: 59 mL/min/{1.73_m2} — ABNORMAL LOW (ref >59)
Globulin, Total: 2.6 g/dL (ref 1.5–4.5)
Glucose: 101 mg/dL — ABNORMAL HIGH (ref 65–99)
Potassium: 3.8 mmol/L (ref 3.5–5.2)
Sodium: 136 mmol/L (ref 134–144)
Total Protein: 6.8 g/dL (ref 6.0–8.5)

## 2019-06-17 LAB — LIPID PANEL
Chol/HDL Ratio: 6.8 ratio — ABNORMAL HIGH (ref 0.0–5.0)
Cholesterol, Total: 265 mg/dL — ABNORMAL HIGH (ref 100–199)
HDL: 39 mg/dL — ABNORMAL LOW (ref >39)
LDL Chol Calc (NIH): 193 mg/dL — ABNORMAL HIGH (ref 0–99)
Triglycerides: 173 mg/dL — ABNORMAL HIGH (ref 0–149)
VLDL Cholesterol Cal: 33 mg/dL (ref 5–40)

## 2019-06-17 LAB — T4: T4, Total: 8.4 ug/dL (ref 4.5–12.0)

## 2019-06-17 LAB — TSH: TSH: 1.33 u[IU]/mL (ref 0.450–4.500)

## 2019-06-17 LAB — T3, FREE: T3, Free: 2.2 pg/mL (ref 2.0–4.4)

## 2019-07-05 ENCOUNTER — Other Ambulatory Visit: Payer: Self-pay | Admitting: Nurse Practitioner

## 2019-07-10 ENCOUNTER — Ambulatory Visit: Payer: Federal, State, Local not specified - PPO | Admitting: Nurse Practitioner

## 2019-08-14 ENCOUNTER — Other Ambulatory Visit: Payer: Self-pay | Admitting: Urology

## 2019-08-15 LAB — HM DIABETES EYE EXAM

## 2019-08-18 ENCOUNTER — Encounter: Payer: Self-pay | Admitting: Nurse Practitioner

## 2019-09-04 ENCOUNTER — Ambulatory Visit (INDEPENDENT_AMBULATORY_CARE_PROVIDER_SITE_OTHER): Payer: Federal, State, Local not specified - PPO | Admitting: Nurse Practitioner

## 2019-09-04 ENCOUNTER — Encounter: Payer: Self-pay | Admitting: Nurse Practitioner

## 2019-09-04 ENCOUNTER — Other Ambulatory Visit: Payer: Self-pay

## 2019-09-04 VITALS — BP 114/76 | HR 67 | Temp 97.9°F | Ht 65.0 in | Wt 166.2 lb

## 2019-09-04 DIAGNOSIS — M545 Low back pain, unspecified: Secondary | ICD-10-CM

## 2019-09-04 DIAGNOSIS — Z7689 Persons encountering health services in other specified circumstances: Secondary | ICD-10-CM | POA: Diagnosis not present

## 2019-09-04 MED ORDER — KETOROLAC TROMETHAMINE 30 MG/ML IJ SOLN
30.0000 mg | Freq: Once | INTRAMUSCULAR | Status: AC
  Administered 2019-09-04: 30 mg via INTRAMUSCULAR

## 2019-09-04 NOTE — Patient Instructions (Signed)
Sciatica Rehab Ask your health care provider which exercises are safe for you. Do exercises exactly as told by your health care provider and adjust them as directed. It is normal to feel mild stretching, pulling, tightness, or discomfort as you do these exercises. Stop right away if you feel sudden pain or your pain gets worse. Do not begin these exercises until told by your health care provider. Stretching and range-of-motion exercises These exercises warm up your muscles and joints and improve the movement and flexibility of your hips and back. These exercises also help to relieve pain, numbness, and tingling. Sciatic nerve glide 1. Sit in a chair with your head facing down toward your chest. Place your hands behind your back. Let your shoulders slump forward. 2. Slowly straighten one of your legs while you tilt your head back as if you are looking toward the ceiling. Only straighten your leg as far as you can without making your symptoms worse. 3. Hold this position for __________ seconds. 4. Slowly return to the starting position. 5. Repeat with your other leg. Repeat __________ times. Complete this exercise __________ times a day. Knee to chest with hip adduction and internal rotation  1. Lie on your back on a firm surface with both legs straight. 2. Bend one of your knees and move it up toward your chest until you feel a gentle stretch in your lower back and buttock. Then, move your knee toward the shoulder that is on the opposite side from your leg. This is hip adduction and internal rotation. ? Hold your leg in this position by holding on to the front of your knee. 3. Hold this position for __________ seconds. 4. Slowly return to the starting position. 5. Repeat with your other leg. Repeat __________ times. Complete this exercise __________ times a day. Prone extension on elbows  1. Lie on your abdomen on a firm surface. A bed may be too soft for this exercise. 2. Prop yourself up on  your elbows. 3. Use your arms to help lift your chest up until you feel a gentle stretch in your abdomen and your lower back. ? This will place some of your body weight on your elbows. If this is uncomfortable, try stacking pillows under your chest. ? Your hips should stay down, against the surface that you are lying on. Keep your hip and back muscles relaxed. 4. Hold this position for __________ seconds. 5. Slowly relax your upper body and return to the starting position. Repeat __________ times. Complete this exercise __________ times a day. Strengthening exercises These exercises build strength and endurance in your back. Endurance is the ability to use your muscles for a long time, even after they get tired. Pelvic tilt This exercise strengthens the muscles that lie deep in the abdomen. 1. Lie on your back on a firm surface. Bend your knees and keep your feet flat on the floor. 2. Tense your abdominal muscles. Tip your pelvis up toward the ceiling and flatten your lower back into the floor. ? To help with this exercise, you may place a small towel under your lower back and try to push your back into the towel. 3. Hold this position for __________ seconds. 4. Let your muscles relax completely before you repeat this exercise. Repeat __________ times. Complete this exercise __________ times a day. Alternating arm and leg raises  1. Get on your hands and knees on a firm surface. If you are on a hard floor, you may want to use   padding, such as an exercise mat, to cushion your knees. 2. Line up your arms and legs. Your hands should be directly below your shoulders, and your knees should be directly below your hips. 3. Lift your left leg behind you. At the same time, raise your right arm and straighten it in front of you. ? Do not lift your leg higher than your hip. ? Do not lift your arm higher than your shoulder. ? Keep your abdominal and back muscles tight. ? Keep your hips facing the  ground. ? Do not arch your back. ? Keep your balance carefully, and do not hold your breath. 4. Hold this position for __________ seconds. 5. Slowly return to the starting position. 6. Repeat with your right leg and your left arm. Repeat __________ times. Complete this exercise __________ times a day. Posture and body mechanics Good posture and healthy body mechanics can help to relieve stress in your body's tissues and joints. Body mechanics refers to the movements and positions of your body while you do your daily activities. Posture is part of body mechanics. Good posture means:  Your spine is in its natural S-curve position (neutral).  Your shoulders are pulled back slightly.  Your head is not tipped forward. Follow these guidelines to improve your posture and body mechanics in your everyday activities. Standing   When standing, keep your spine neutral and your feet about hip width apart. Keep a slight bend in your knees. Your ears, shoulders, and hips should line up.  When you do a task in which you stand in one place for a long time, place one foot up on a stable object that is 2-4 inches (5-10 cm) high, such as a footstool. This helps keep your spine neutral. Sitting   When sitting, keep your spine neutral and keep your feet flat on the floor. Use a footrest, if necessary, and keep your thighs parallel to the floor. Avoid rounding your shoulders, and avoid tilting your head forward.  When working at a desk or a computer, keep your desk at a height where your hands are slightly lower than your elbows. Slide your chair under your desk so you are close enough to maintain good posture.  When working at a computer, place your monitor at a height where you are looking straight ahead and you do not have to tilt your head forward or downward to look at the screen. Resting  When lying down and resting, avoid positions that are most painful for you.  If you have pain with activities  such as sitting, bending, stooping, or squatting, lie in a position in which your body does not bend very much. For example, avoid curling up on your side with your arms and knees near your chest (fetal position).  If you have pain with activities such as standing for a long time or reaching with your arms, lie with your spine in a neutral position and bend your knees slightly. Try the following positions: ? Lying on your side with a pillow between your knees. ? Lying on your back with a pillow under your knees. Lifting   When lifting objects, keep your feet at least shoulder width apart and tighten your abdominal muscles.  Bend your knees and hips and keep your spine neutral. It is important to lift using the strength of your legs, not your back. Do not lock your knees straight out.  Always ask for help to lift heavy or awkward objects. This information is not   intended to replace advice given to you by your health care provider. Make sure you discuss any questions you have with your health care provider. Document Revised: 05/03/2018 Document Reviewed: 01/31/2018 Elsevier Patient Education  2020 [employer]  

## 2019-09-04 NOTE — Progress Notes (Signed)
I,Samuel [name],acting as a Neurosurgeon for [employer], FNP.,have documented all relevant documentation on the behalf of Samuel Felts, FNP,as directed by  Samuel Felts, FNP while in the presence of Samuel Felts, FNP.  This visit occurred during the SARS-CoV-2 public health emergency.  Safety protocols were in place, including screening questions prior to the visit, additional usage of staff PPE, and extensive cleaning of exam room while observing appropriate contact time as indicated for disinfecting solutions.  Subjective:     Patient ID: Samuel [name] , male    DOB: 1935/08/02 , 84 y.o.   MRN: [id number]   Chief Complaint  Patient presents with  . referral to physical therapy    HPI  The patient is here today for a referral to physical therapy.  Back Pain This is a recurrent problem. The current episode started 1 to 4 weeks ago (2 weeks ago). The problem has been gradually worsening since onset. The pain is present in the lumbar spine. The quality of the pain is described as aching. Radiates to: feels like down in his hip as well. The pain is at a severity of 3/10 (at its worse is an 8). The pain is moderate. The symptoms are aggravated by position. Pertinent negatives include no abdominal pain or headaches. Risk factors include sedentary lifestyle and lack of exercise. He has tried NSAIDs for the symptoms. The treatment provided mild relief.     Past Medical History:  Diagnosis Date  . Hyperlipidemia   . Hypothyroid      History reviewed. No pertinent family history.   Current Outpatient Medications:  .  atorvastatin (LIPITOR) 20 MG tablet, Take 1 tablet (20 mg total) by mouth daily., Disp: 90 tablet, Rfl: 1 .  cholecalciferol (VITAMIN D3) 25 MCG (1000 UT) tablet, Take 1,000 Units by mouth daily. Take 3000 units daily, Disp: , Rfl:  .  dutasteride (AVODART) 0.5 MG capsule, TAKE 1 CAPSULE BY MOUTH EVERY DAY, Disp: 90 capsule, Rfl: 3 .  levothyroxine (SYNTHROID) 100 MCG tablet,  TAKE 1 TABLET BY MOUTH EVERY DAY, Disp: 90 tablet, Rfl: 0 .  tadalafil (CIALIS) 5 MG tablet, Take 5 mg by mouth daily as needed for erectile dysfunction., Disp: , Rfl:   Current Facility-Administered Medications:  .  ketorolac (TORADOL) 30 MG/ML injection 30 mg, 30 mg, Intramuscular, Once, Samuel Felts, FNP   No Known Allergies   Review of Systems  Constitutional: Negative.  Negative for fatigue.  Respiratory: Negative.   Cardiovascular: Negative.   Gastrointestinal: Negative.  Negative for abdominal pain.  Musculoskeletal: Positive for back pain.  Neurological: Negative for headaches.  Psychiatric/Behavioral: Negative.   All other systems reviewed and are negative.    Today's Vitals   09/04/19 1143  BP: 114/76  Pulse: 67  Temp: 97.9 F (36.6 C)  TempSrc: Oral  Weight: 166 lb 3.2 oz (75.4 kg)  Height: 5\' 5"  (1.651 m)  PainSc: 3   PainLoc: Back   Body mass index is 27.66 kg/m.  Wt Readings from Last 3 Encounters:  09/04/19 166 lb 3.2 oz (75.4 kg)  06/16/19 169 lb (76.7 kg)  01/09/19 173 lb 3.2 oz (78.6 kg)   Objective:  Physical Exam Vitals and nursing note reviewed.  Constitutional:      General: He is not in acute distress.    Appearance: Normal appearance.  HENT:     Head: Normocephalic and atraumatic.  Cardiovascular:     Rate and Rhythm: Normal rate and regular rhythm.     Pulses: Normal  pulses.     Heart sounds: Normal heart sounds. No murmur heard.   Pulmonary:     Effort: Pulmonary effort is normal. No respiratory distress.     Breath sounds: Normal breath sounds.  Musculoskeletal:        General: Tenderness ([name] back tender, negative straight leg raise) present. No swelling.  Skin:    General: Skin is warm.  Neurological:     General: No focal deficit present.     Mental Status: He is alert and oriented to person, place, and time.     Cranial Nerves: No cranial nerve deficit.  Psychiatric:        Mood and Affect: Mood normal.        Behavior:  Behavior normal.        Thought Content: Thought content normal.        Judgment: Judgment normal.         Assessment And Plan:     1. Need for referral to physical therapist  Will refer to PT, negative straight leg raise however he is guarding when he stands up  2. Acute bilateral [name] back pain without sciatica  Will treat with toradol and encouraged to stretch as tolerated - ketorolac (TORADOL) 30 MG/ML injection 30 mg - Ambulatory referral to Physical Therapy     Patient was given opportunity to ask questions. Patient verbalized understanding of the plan and was able to repeat key elements of the plan. All questions were answered to their satisfaction.  Samuel Felts, FNP   I, Samuel Felts, FNP, have reviewed all documentation for this visit. The documentation on 09/04/19 for the exam, diagnosis, procedures, and orders are all accurate and complete.  THE PATIENT IS ENCOURAGED TO PRACTICE SOCIAL DISTANCING DUE TO THE COVID-19 PANDEMIC.

## 2019-09-07 ENCOUNTER — Other Ambulatory Visit: Payer: Self-pay | Admitting: Urology

## 2019-09-30 ENCOUNTER — Other Ambulatory Visit: Payer: Self-pay | Admitting: Nurse Practitioner

## 2019-12-06 ENCOUNTER — Other Ambulatory Visit: Payer: Self-pay | Admitting: Nurse Practitioner

## 2019-12-06 DIAGNOSIS — E785 Hyperlipidemia, unspecified: Secondary | ICD-10-CM

## 2019-12-26 ENCOUNTER — Other Ambulatory Visit: Payer: Self-pay | Admitting: Internal Medicine

## 2020-01-13 NOTE — Progress Notes (Signed)
I,Samuel [name],acting as a Education administrator for [employer], FNP.,have documented all relevant documentation on the behalf of Samuel Brine, FNP,as directed by  Samuel Brine, FNP while in the presence of Samuel [name], [place].  This visit occurred during the SARS-CoV-2 public health emergency.  Safety protocols were in place, including screening questions prior to the visit, additional usage of staff PPE, and extensive cleaning of exam room while observing appropriate contact time as indicated for disinfecting solutions.  Subjective:     Patient ID: Samuel [name] , male    DOB: 07/18/1935 , 84 y.o.   MRN: [id number]   Chief Complaint  Patient presents with  . Annual Exam    HPI  Patient is here for physical exam. He reports compliance with medications. He has no concerns at this time.  Thyroid Problem Presents for follow-up visit. Patient reports no anxiety, diarrhea or palpitations. The symptoms have been stable.     Past Medical History:  Diagnosis Date  . Hyperlipidemia   . Hypothyroid      History reviewed. No pertinent family history.   Current Outpatient Medications:  .  atorvastatin (LIPITOR) 20 MG tablet, TAKE 1 TABLET BY MOUTH EVERY DAY, Disp: 90 tablet, Rfl: 1 .  cholecalciferol (VITAMIN D3) 25 MCG (1000 UT) tablet, Take 1,000 Units by mouth daily. Take 3000 units daily, Disp: , Rfl:  .  dutasteride (AVODART) 0.5 MG capsule, TAKE 1 CAPSULE BY MOUTH EVERY DAY, Disp: 90 capsule, Rfl: 3 .  levothyroxine (SYNTHROID) 100 MCG tablet, TAKE 1 TABLET BY MOUTH EVERY DAY, Disp: 90 tablet, Rfl: 0 .  tadalafil (CIALIS) 5 MG tablet, TAKE 1 TABLET BY MOUTH EVERY DAY, Disp: 30 tablet, Rfl: 11 .  Tdap (BOOSTRIX) 5-2.5-18.5 LF-MCG/0.5 injection, Inject 0.5 mLs into the muscle once for 1 dose., Disp: 0.5 mL, Rfl: 0   No Known Allergies   Men's preventive visit. Patient Health Questionnaire (PHQ-2) is  [place] Office Visit from 09/04/2019 in [facility]  PHQ-2 Total  Score 0     Patient is on a Regular; and will do mediteranean 3 times a week diet. [facility] Marital status: Married. Relevant history for alcohol use is:  Social History   Substance and Sexual Activity  Alcohol Use Yes   Comment: ocassionally   Relevant history for tobacco use is:  Social History   Tobacco Use  Smoking Status Former Smoker  Smokeless Tobacco Never Used  .   Review of Systems  Constitutional: Negative.   HENT: Negative.   Eyes: Negative.   Respiratory: Negative.   Cardiovascular: Negative.  Negative for chest pain, palpitations and leg swelling.  Gastrointestinal: Negative.  Negative for diarrhea.  Endocrine: Negative.   Genitourinary: Positive for urgency.       Occasional urinary incontinence  Musculoskeletal: Negative.   Skin: Negative.   Allergic/Immunologic: Negative.   Neurological: Negative.   Hematological: Negative.   Psychiatric/Behavioral: Negative.  The patient is not nervous/anxious.      Today's Vitals   01/13/20 1718  BP: 120/82  Pulse: 71  Temp: 98.2 F (36.8 C)  TempSrc: Oral  Weight: 167 lb 3.2 oz (75.8 kg)  Height: $Remove'5\' 5"'pmFdrVP$  (1.651 m)  PainSc: 0-No pain   Body mass index is 27.82 kg/m.   Objective:  Physical Exam Vitals reviewed.  Constitutional:      General: He is not in acute distress.    Appearance: Normal appearance. He is obese.  HENT:  Head: Normocephalic and atraumatic.     Right Ear: Tympanic membrane, ear canal and external ear normal. There is no impacted cerumen.     Left Ear: Tympanic membrane, ear canal and external ear normal. There is no impacted cerumen.     Nose:     Comments: Deferred - masked    Mouth/Throat:     Comments: Deferred - masked Cardiovascular:     Rate and Rhythm: Normal rate and regular rhythm.     Pulses: Normal pulses.     Heart sounds: Normal heart sounds. No murmur heard.   Pulmonary:     Effort: Pulmonary effort is normal. No respiratory distress.      Breath sounds: Normal breath sounds.  Abdominal:     General: Abdomen is flat. Bowel sounds are normal. There is no distension.     Palpations: Abdomen is soft.  Genitourinary:    Prostate: Normal.     Rectum: Guaiac result negative.  Musculoskeletal:        General: No swelling or tenderness. Normal range of motion.     Cervical back: Normal range of motion and neck supple.     Right lower leg: No edema.     Left lower leg: No edema.  Skin:    General: Skin is warm.     Capillary Refill: Capillary refill takes less than 2 seconds.  Neurological:     General: No focal deficit present.     Mental Status: He is alert and oriented to person, place, and time.  Psychiatric:        Mood and Affect: Mood normal.        Behavior: Behavior normal.        Thought Content: Thought content normal.        Judgment: Judgment normal.         Assessment And Plan:    1. Routine general medical examination at a health care facility . Behavior modifications discussed and diet history reviewed.   . Pt will continue to exercise regularly and modify diet with low GI, plant based foods and decrease intake of processed foods.  . Recommend intake of daily multivitamin, Vitamin D, and calcium.  . Recommend for preventive screenings, as well as recommend immunizations that include influenza, TDAP (will check to see if has had in last 10 years)  2. Hyperlipidemia, unspecified hyperlipidemia type  Chronic, controlled  Continue with current medications, tolerating well - CMP14+EGFR - Lipid panel  3. Acquired hypothyroidism  Chronic, stable  Continue with current medications - T3, free - T4 - TSH  4. Vitamin D deficiency  Will check vitamin D level and supplement as needed.     Also encouraged to spend 15 minutes in the sun daily.  - VITAMIN D 25 Hydroxy (Vit-D Deficiency, Fractures)  5. Other long term (current) drug therapy - CBC  6. Encounter for immunization  Pneumonia 23  given in office   Rx for tdap sent to pharmacy - Pneumococcal polysaccharide vaccine 23-valent greater than or equal to 2yo subcutaneous/IM - Tdap (BOOSTRIX) 5-2.5-18.5 LF-MCG/0.5 injection; Inject 0.5 mLs into the muscle once for 1 dose.  Dispense: 0.5 mL; Refill: 0    Patient was given opportunity to ask questions. Patient verbalized understanding of the plan and was able to repeat key elements of the plan. All questions were answered to their satisfaction.   Arnette Felts, FNP   I, Arnette Felts, FNP, have reviewed all documentation for this visit. The documentation on 01/14/20 for  the exam, diagnosis, procedures, and orders are all accurate and complete.   THE PATIENT IS ENCOURAGED TO PRACTICE SOCIAL DISTANCING DUE TO THE COVID-19 PANDEMIC.

## 2020-01-13 NOTE — Patient Instructions (Signed)

## 2020-01-14 ENCOUNTER — Ambulatory Visit (INDEPENDENT_AMBULATORY_CARE_PROVIDER_SITE_OTHER): Payer: Federal, State, Local not specified - PPO | Admitting: Nurse Practitioner

## 2020-01-14 ENCOUNTER — Other Ambulatory Visit: Payer: Self-pay

## 2020-01-14 ENCOUNTER — Encounter: Payer: Self-pay | Admitting: Nurse Practitioner

## 2020-01-14 VITALS — BP 120/82 | HR 71 | Temp 98.2°F | Ht 65.0 in | Wt 167.2 lb

## 2020-01-14 DIAGNOSIS — Z0000 Encounter for general adult medical examination without abnormal findings: Secondary | ICD-10-CM

## 2020-01-14 DIAGNOSIS — E785 Hyperlipidemia, unspecified: Secondary | ICD-10-CM | POA: Diagnosis not present

## 2020-01-14 DIAGNOSIS — E039 Hypothyroidism, unspecified: Secondary | ICD-10-CM

## 2020-01-14 DIAGNOSIS — E559 Vitamin D deficiency, unspecified: Secondary | ICD-10-CM

## 2020-01-14 DIAGNOSIS — Z23 Encounter for immunization: Secondary | ICD-10-CM

## 2020-01-14 DIAGNOSIS — Z79899 Other long term (current) drug therapy: Secondary | ICD-10-CM

## 2020-01-14 MED ORDER — TETANUS-DIPHTH-ACELL PERTUSSIS 5-2.5-18.5 LF-MCG/0.5 IM SUSP: 0.5000 mL | Freq: Once | INTRAMUSCULAR | 0 refills | Status: AC

## 2020-01-15 LAB — LIPID PANEL
Chol/HDL Ratio: 4 ratio (ref 0.0–5.0)
Cholesterol, Total: 176 mg/dL (ref 100–199)
HDL: 44 mg/dL (ref >39)
LDL Chol Calc (NIH): 109 mg/dL — ABNORMAL HIGH (ref 0–99)
Triglycerides: 130 mg/dL (ref 0–149)
VLDL Cholesterol Cal: 23 mg/dL (ref 5–40)

## 2020-01-15 LAB — CBC
Hematocrit: 38.6 % (ref 37.5–51.0)
Hemoglobin: 12.9 g/dL — ABNORMAL LOW (ref 13.0–17.7)
MCH: 31.5 pg (ref 26.6–33.0)
MCHC: 33.4 g/dL (ref 31.5–35.7)
MCV: 94 fL (ref 79–97)
Platelets: 193 10*3/uL (ref 150–450)
RBC: 4.09 x10E6/uL — ABNORMAL LOW (ref 4.14–5.80)
RDW: 10.6 % — ABNORMAL LOW (ref 11.6–15.4)
WBC: 5.2 10*3/uL (ref 3.4–10.8)

## 2020-01-15 LAB — T4: T4, Total: 9.5 ug/dL (ref 4.5–12.0)

## 2020-01-15 LAB — CMP14+EGFR
ALT: 27 IU/L (ref 0–44)
AST: 23 IU/L (ref 0–40)
Albumin/Globulin Ratio: 1.8 (ref 1.2–2.2)
Albumin: 4.5 g/dL (ref 3.6–4.6)
Alkaline Phosphatase: 76 IU/L (ref 44–121)
BUN/Creatinine Ratio: 9 — ABNORMAL LOW (ref 10–24)
BUN: 10 mg/dL (ref 8–27)
Bilirubin Total: 0.5 mg/dL (ref 0.0–1.2)
CO2: 24 mmol/L (ref 20–29)
Calcium: 9.7 mg/dL (ref 8.6–10.2)
Chloride: 99 mmol/L (ref 96–106)
Creatinine, Ser: 1.08 mg/dL (ref 0.76–1.27)
GFR calc Af Amer: 74 mL/min/{1.73_m2} (ref >59)
GFR calc non Af Amer: 64 mL/min/{1.73_m2} (ref >59)
Globulin, Total: 2.5 g/dL (ref 1.5–4.5)
Glucose: 114 mg/dL — ABNORMAL HIGH (ref 65–99)
Potassium: 4 mmol/L (ref 3.5–5.2)
Sodium: 137 mmol/L (ref 134–144)
Total Protein: 7 g/dL (ref 6.0–8.5)

## 2020-01-15 LAB — T3, FREE: T3, Free: 2.8 pg/mL (ref 2.0–4.4)

## 2020-01-15 LAB — TSH: TSH: 1.66 u[IU]/mL (ref 0.450–4.500)

## 2020-01-15 LAB — VITAMIN D 25 HYDROXY (VIT D DEFICIENCY, FRACTURES): Vit D, 25-Hydroxy: 51.9 ng/mL (ref 30.0–100.0)

## 2020-03-28 ENCOUNTER — Other Ambulatory Visit: Payer: Self-pay | Admitting: Nurse Practitioner

## 2020-05-20 ENCOUNTER — Other Ambulatory Visit: Payer: Self-pay

## 2020-05-20 ENCOUNTER — Telehealth (INDEPENDENT_AMBULATORY_CARE_PROVIDER_SITE_OTHER): Payer: Federal, State, Local not specified - PPO | Admitting: Nurse Practitioner

## 2020-05-20 ENCOUNTER — Encounter: Payer: Self-pay | Admitting: Nurse Practitioner

## 2020-05-20 ENCOUNTER — Telehealth: Payer: Self-pay

## 2020-05-20 VITALS — Wt 168.0 lb

## 2020-05-20 DIAGNOSIS — R059 Cough, unspecified: Secondary | ICD-10-CM | POA: Diagnosis not present

## 2020-05-20 DIAGNOSIS — R0981 Nasal congestion: Secondary | ICD-10-CM

## 2020-05-20 DIAGNOSIS — U071 COVID-19: Secondary | ICD-10-CM

## 2020-05-20 MED ORDER — BENZONATATE 100 MG PO CAPS
100.0000 mg | ORAL_CAPSULE | Freq: Four times a day (QID) | ORAL | 1 refills | Status: DC | PRN
Start: 2020-05-20 — End: 2021-01-19

## 2020-05-20 MED ORDER — FLUTICASONE PROPIONATE 50 MCG/ACT NA SUSP
1.0000 | Freq: Every day | NASAL | 2 refills | Status: DC
Start: 2020-05-20 — End: 2021-01-19

## 2020-05-20 NOTE — Progress Notes (Signed)
Virtual Visit via MyChart   This visit type was conducted due to national recommendations for restrictions regarding the COVID-19 Pandemic (e.g. social distancing) in an effort to limit this patient's exposure and mitigate transmission in our community.  Due to his co-morbid illnesses, this patient is at least at moderate risk for complications without adequate follow up.  This format is felt to be most appropriate for this patient at this time.  All issues noted in this document were discussed and addressed.  A limited physical exam was performed with this format.    This visit type was conducted due to national recommendations for restrictions regarding the COVID-19 Pandemic (e.g. social distancing) in an effort to limit this patient's exposure and mitigate transmission in our community.  Patients identity confirmed using two different identifiers.  This format is felt to be most appropriate for this patient at this time.  All issues noted in this document were discussed and addressed.  No physical exam was performed (except for noted visual exam findings with Video Visits).    Date:  05/20/2020   ID:  Samuel [name], DOB Jan 13, 1936, MRN [id number]  Patient Location:  Home - spoke with Samuel [name]  Provider location:   Office    Chief Complaint:  Congestion   History of Present Illness:    Samuel [name] is a 85 y.o. male who presents via video conferencing for a telehealth visit today.    The patient does have symptoms concerning for COVID-19 infection (fever, chills, cough, or new shortness of breath).   He has been having a runny nose and congestion over night over the last 4 days. Unsure if has had a fever, denies chills. Denies nausea, vomiting or diarrhea. Denies headache but has had a sore neck. He has taken [facility] allergy medication. Dry cough. He is taking a cough suppressant.      Past Medical History:  Diagnosis Date  . Hyperlipidemia   . Hypothyroid    No past surgical  history on file.   Current Meds  Medication Sig  . atorvastatin (LIPITOR) 20 MG tablet TAKE 1 TABLET BY MOUTH EVERY DAY  . benzonatate (TESSALON PERLES) 100 MG capsule Take 1 capsule (100 mg total) by mouth every 6 (six) hours as needed for cough.  . cholecalciferol (VITAMIN D3) 25 MCG (1000 UT) tablet Take 1,000 Units by mouth daily. Take 3000 units daily  . dutasteride (AVODART) 0.5 MG capsule TAKE 1 CAPSULE BY MOUTH EVERY DAY  . fluticasone (FLONASE) 50 MCG/ACT nasal spray Place 1 spray into both nostrils daily.  Marland Kitchen levothyroxine (SYNTHROID) 100 MCG tablet TAKE 1 TABLET BY MOUTH EVERY DAY  . tadalafil (CIALIS) 5 MG tablet TAKE 1 TABLET BY MOUTH EVERY DAY     Allergies:   Patient has no known allergies.   Social History   Tobacco Use  . Smoking status: Former Games developer  . Smokeless tobacco: Never Used  Substance Use Topics  . Alcohol use: Yes    Comment: ocassionally  . Drug use: Never     Family Hx: The patient's family history is not on file.  ROS:   Please see the history of present illness.    Review of Systems  Constitutional: Negative.  Negative for chills and fever.  Respiratory: Positive for cough (dry cough). Negative for sputum production, shortness of breath and wheezing.   Cardiovascular: Negative.   Gastrointestinal: Negative for diarrhea, nausea and vomiting.  Musculoskeletal: Positive for myalgias.  Neurological: Negative for dizziness and headaches.  Psychiatric/Behavioral: Negative.     All other systems reviewed and are negative.   Labs/Other Tests and Data Reviewed:    Recent Labs: 01/14/2020: ALT 27; BUN 10; Creatinine, Ser 1.08; Hemoglobin 12.9; Platelets 193; Potassium 4.0; Sodium 137; TSH 1.660   Recent Lipid Panel Lab Results  Component Value Date/Time   CHOL 176 01/14/2020 10:39 AM   TRIG 130 01/14/2020 10:39 AM   HDL 44 01/14/2020 10:39 AM   CHOLHDL 4.0 01/14/2020 10:39 AM   LDLCALC 109 (H) 01/14/2020 10:39 AM    Wt Readings from Last  3 Encounters:  05/20/20 168 lb (76.2 kg)  01/13/20 167 lb 3.2 oz (75.8 kg)  09/04/19 166 lb 3.2 oz (75.4 kg)     Exam:    Vital Signs:  Wt 168 lb (76.2 kg)   BMI 27.96 kg/m     Physical Exam Vitals reviewed.  Constitutional:      General: He is not in acute distress.    Appearance: Normal appearance.  Pulmonary:     Effort: Pulmonary effort is normal. No respiratory distress.  Neurological:     Mental Status: He is alert.     ASSESSMENT & PLAN:     1. Cough  4 day history of dry cough keeping him up at night   Will treat with tessalon perles  Will also check for covid  - fluticasone (FLONASE) 50 MCG/ACT nasal spray; Place 1 spray into both nostrils daily.  Dispense: 16 g; Refill: 2 - benzonatate (TESSALON PERLES) 100 MG capsule; Take 1 capsule (100 mg total) by mouth every 6 (six) hours as needed for cough.  Dispense: 30 capsule; Refill: 1 - Novel Coronavirus, NAA ([facility])  2. Nasal congestion  flonase nasal spray given  Will come for covid test here at the office - Novel Coronavirus, NAA ([facility])   COVID-19 Education: The signs and symptoms of COVID-19 were discussed with the patient and how to seek care for testing (follow up with PCP or arrange E-visit).  The importance of social distancing was discussed today.  Patient Risk:   After full review of this patients clinical status, I feel that they are at least moderate risk at this time.  Time:   Today, I have spent 7.5 minutes/ seconds with the patient with telehealth technology discussing above diagnoses.     Medication Adjustments/Labs and Tests Ordered: Current medicines are reviewed at length with the patient today.  Concerns regarding medicines are outlined above.   Tests Ordered: Orders Placed This Encounter  Procedures  . Novel Coronavirus, NAA ([facility])    Medication Changes: Meds ordered this encounter  Medications  . fluticasone (FLONASE) 50 MCG/ACT nasal spray    Sig: Place 1 spray  into both nostrils daily.    Dispense:  16 g    Refill:  2  . benzonatate (TESSALON PERLES) 100 MG capsule    Sig: Take 1 capsule (100 mg total) by mouth every 6 (six) hours as needed for cough.    Dispense:  30 capsule    Refill:  1    Disposition:  Follow up prn  Signed, Arnette Felts, FNP

## 2020-05-20 NOTE — Patient Instructions (Addendum)
Encouraged to take multivitamin, vitamin d, vitamin c and zinc to increase immune system. Aware can call office if would like to have vaccine here at office. Stay well hydrated with water.   If has shortness of breath or chest pain to go to ER for further evaluation

## 2020-05-20 NOTE — Addendum Note (Signed)
Addended by: Arnette Felts F on: 05/20/2020 03:57 PM   Modules accepted: Orders

## 2020-05-20 NOTE — Telephone Encounter (Signed)
Patient consented to virtual appt YL,RMA 

## 2020-05-21 LAB — SARS-COV-2, NAA 2 DAY TAT

## 2020-05-21 LAB — NOVEL CORONAVIRUS, NAA: SARS-CoV-2, NAA: DETECTED — AB

## 2020-05-22 ENCOUNTER — Telehealth: Payer: Self-pay | Admitting: Physician Assistant

## 2020-05-22 NOTE — Telephone Encounter (Signed)
Called to discuss with [name] about Covid symptoms and the use of bebtelivomab, remdisivir or oral therapies for those with mild to moderate Covid symptoms and at a high risk of hospitalization.     Pt does not qualify as pt's symptoms first presented > 5 days prior to timing of oral medications and we don't offer the infusion until next Monday when he would be out past day 7. Symptoms tier reviewed as well as criteria for ending isolation. Preventative practices reviewed. Patient verbalized understanding   Patient Active Problem List   Diagnosis Date Noted  . Encounter for general adult medical examination w/o abnormal findings 12/31/2017  . Benign prostatic hyperplasia without lower urinary tract symptoms 12/31/2017  . Hyperlipidemia 12/31/2017  . Vitamin D deficiency 12/31/2017  . Acquired hypothyroidism 12/31/2017    Cline Crock PA-C

## 2020-05-24 ENCOUNTER — Telehealth: Payer: Self-pay

## 2020-05-24 NOTE — Telephone Encounter (Signed)
-----   Message from Arnette Felts, FNP sent at 05/22/2020  6:40 AM EDT ----- Your covid test we sent to the pharmacy is positive, continue to remain in isolation for 10 days. Call us if your symptoms stop prior to the 10 days

## 2020-05-24 NOTE — Telephone Encounter (Signed)
I left the pt a detailed message and left a message for him to call the office back, Christell Constant, FNP wanted to check on the patient.

## 2020-06-06 ENCOUNTER — Other Ambulatory Visit: Payer: Self-pay | Admitting: Nurse Practitioner

## 2020-06-06 DIAGNOSIS — E785 Hyperlipidemia, unspecified: Secondary | ICD-10-CM

## 2020-06-25 ENCOUNTER — Other Ambulatory Visit: Payer: Self-pay | Admitting: Nurse Practitioner

## 2020-07-14 ENCOUNTER — Other Ambulatory Visit: Payer: Self-pay

## 2020-07-14 ENCOUNTER — Ambulatory Visit (INDEPENDENT_AMBULATORY_CARE_PROVIDER_SITE_OTHER): Payer: Federal, State, Local not specified - PPO | Admitting: Nurse Practitioner

## 2020-07-14 ENCOUNTER — Encounter: Payer: Self-pay | Admitting: Nurse Practitioner

## 2020-07-14 VITALS — BP 122/80 | HR 78 | Temp 98.1°F | Ht 64.2 in | Wt 167.2 lb

## 2020-07-14 DIAGNOSIS — E559 Vitamin D deficiency, unspecified: Secondary | ICD-10-CM

## 2020-07-14 DIAGNOSIS — E039 Hypothyroidism, unspecified: Secondary | ICD-10-CM

## 2020-07-14 DIAGNOSIS — Z8616 Personal history of COVID-19: Secondary | ICD-10-CM | POA: Diagnosis not present

## 2020-07-14 DIAGNOSIS — E785 Hyperlipidemia, unspecified: Secondary | ICD-10-CM

## 2020-07-14 NOTE — Progress Notes (Signed)
I,Yamilka Roman [employer] as a Education administrator for [employer], FNP.,have documented all relevant documentation on the behalf of [name], FNP,as directed by  [name], FNP while in the presence of [name], [place].  This visit occurred during the SARS-CoV-2 public health emergency.  Safety protocols were in place, including screening questions prior to the visit, additional usage of staff PPE, and extensive cleaning of exam room while observing appropriate contact time as indicated for disinfecting solutions.  Subjective:     Patient ID: Samuel [name] , male    DOB: 1935/05/13 , 85 y.o.   MRN: [id number]   Chief Complaint  Patient presents with   Hyperlipidemia    HPI  Patient presents today for a cholesterol f/u   Wt Readings from Last 3 Encounters: 07/14/20 : 167 lb 3.2 oz (75.8 kg) 05/20/20 : 168 lb (76.2 kg) 01/13/20 : 167 lb 3.2 oz (75.8 kg)   He would like to get the 2nd covid booster, he had covid in April overall he is doing well.    Hyperlipidemia This is a chronic problem. The current episode started more than 1 year ago. Recent lipid tests were reviewed and are normal. He has no history of chronic renal disease. There are no known factors aggravating his hyperlipidemia. Pertinent negatives include no chest pain. There are no compliance problems.  There are no known risk factors for coronary artery disease.  Thyroid Problem Presents for follow-up visit. Patient reports no fatigue or palpitations. The symptoms have been stable. His past medical history is significant for hyperlipidemia.    Past Medical History:  Diagnosis Date   Hyperlipidemia    Hypothyroid      No family history on file.   Current Outpatient Medications:    atorvastatin (LIPITOR) 20 MG tablet, TAKE 1 TABLET BY MOUTH EVERY DAY, Disp: 90 tablet, Rfl: 1   cholecalciferol (VITAMIN D3) 25 MCG (1000 UT) tablet, Take 1,000 Units by mouth daily. Take 3000 units daily, Disp: , Rfl:    dutasteride  (AVODART) 0.5 MG capsule, TAKE 1 CAPSULE BY MOUTH EVERY DAY, Disp: 90 capsule, Rfl: 3   levothyroxine (SYNTHROID) 100 MCG tablet, TAKE 1 TABLET BY MOUTH EVERY DAY, Disp: 90 tablet, Rfl: 0   tadalafil (CIALIS) 5 MG tablet, TAKE 1 TABLET BY MOUTH EVERY DAY, Disp: 30 tablet, Rfl: 11   benzonatate (TESSALON PERLES) 100 MG capsule, Take 1 capsule (100 mg total) by mouth every 6 (six) hours as needed for cough. (Patient not taking: Reported on 07/14/2020), Disp: 30 capsule, Rfl: 1   fluticasone (FLONASE) 50 MCG/ACT nasal spray, Place 1 spray into both nostrils daily. (Patient not taking: Reported on 07/14/2020), Disp: 16 g, Rfl: 2   No Known Allergies   Review of Systems  Constitutional: Negative.  Negative for fatigue.  Respiratory: Negative.  Negative for cough and wheezing.   Cardiovascular:  Negative for chest pain, palpitations and leg swelling.  Endocrine: Negative for polydipsia, polyphagia and polyuria.  Neurological:  Negative for dizziness and headaches.  Psychiatric/Behavioral: Negative.      Today's Vitals   07/14/20 0847  BP: 122/80  Pulse: 78  Temp: 98.1 F (36.7 C)  Weight: 167 lb 3.2 oz (75.8 kg)  Height: 5' 4.2" (1.631 m)  PainSc: 0-No pain   Body mass index is 28.52 kg/m.   Objective:  Physical Exam Vitals reviewed.  Constitutional:      General: He is not in acute distress.    Appearance: Normal appearance.  Cardiovascular:  Rate and Rhythm: Normal rate and regular rhythm.     Pulses: Normal pulses.     Heart sounds: Normal heart sounds. No murmur heard. Pulmonary:     Effort: Pulmonary effort is normal. No respiratory distress.     Breath sounds: Normal breath sounds.  Skin:    Capillary Refill: Capillary refill takes less than 2 seconds.  Neurological:     General: No focal deficit present.     Mental Status: He is alert and oriented to person, place, and time.     Cranial Nerves: No cranial nerve deficit.  Psychiatric:        Mood and Affect: Mood  normal.        Behavior: Behavior normal.        Thought Content: Thought content normal.        Judgment: Judgment normal.        Assessment And Plan:     1. Hyperlipidemia, unspecified hyperlipidemia type Chronic, controlled Continue with current medications, tolerating well - CMP14+EGFR  2. Vitamin D deficiency Will check vitamin D level and supplement as needed.    Also encouraged to spend 15 minutes in the sun daily.  - VITAMIN D 25 Hydroxy (Vit-D Deficiency, Fractures)  3. Acquired hypothyroidism Chronic, controlled Continue with current medications - CMP14+EGFR - TSH - T4 - T3, free  4. History of COVID-19 Had covid in April, overall doing well  We will call his pharmacy to see if he has had his tdap.    Patient was given opportunity to ask questions. Patient verbalized understanding of the plan and was able to repeat key elements of the plan. All questions were answered to their satisfaction.  [name], FNP   I, [name], FNP, have reviewed all documentation for this visit. The documentation on 07/14/20 for the exam, diagnosis, procedures, and orders are all accurate and complete.   IF YOU HAVE BEEN REFERRED TO A SPECIALIST, IT MAY TAKE 1-2 WEEKS TO SCHEDULE/PROCESS THE REFERRAL. IF YOU HAVE NOT HEARD FROM US/SPECIALIST IN TWO WEEKS, PLEASE GIVE [place] A CALL AT [phone].   THE PATIENT IS ENCOURAGED TO PRACTICE SOCIAL DISTANCING DUE TO THE COVID-19 PANDEMIC.

## 2020-07-15 LAB — CMP14+EGFR
ALT: 22 IU/L (ref 0–44)
AST: 21 IU/L (ref 0–40)
Albumin/Globulin Ratio: 1.8 (ref 1.2–2.2)
Albumin: 4.4 g/dL (ref 3.6–4.6)
Alkaline Phosphatase: 75 IU/L (ref 44–121)
BUN/Creatinine Ratio: 7 — ABNORMAL LOW (ref 10–24)
BUN: 8 mg/dL (ref 8–27)
Bilirubin Total: 0.5 mg/dL (ref 0.0–1.2)
CO2: 25 mmol/L (ref 20–29)
Calcium: 10 mg/dL (ref 8.6–10.2)
Chloride: 102 mmol/L (ref 96–106)
Creatinine, Ser: 1.08 mg/dL (ref 0.76–1.27)
Globulin, Total: 2.4 g/dL (ref 1.5–4.5)
Glucose: 113 mg/dL — ABNORMAL HIGH (ref 65–99)
Potassium: 4.4 mmol/L (ref 3.5–5.2)
Sodium: 142 mmol/L (ref 134–144)
Total Protein: 6.8 g/dL (ref 6.0–8.5)
eGFR: 69 mL/min/{1.73_m2} (ref >59)

## 2020-07-15 LAB — T3, FREE: T3, Free: 2.4 pg/mL (ref 2.0–4.4)

## 2020-07-15 LAB — T4: T4, Total: 10 ug/dL (ref 4.5–12.0)

## 2020-07-15 LAB — TSH: TSH: 1.89 u[IU]/mL (ref 0.450–4.500)

## 2020-07-15 LAB — VITAMIN D 25 HYDROXY (VIT D DEFICIENCY, FRACTURES): Vit D, 25-Hydroxy: 42.6 ng/mL (ref 30.0–100.0)

## 2020-08-09 ENCOUNTER — Other Ambulatory Visit: Payer: Self-pay | Admitting: Urology

## 2020-08-27 ENCOUNTER — Other Ambulatory Visit: Payer: Self-pay | Admitting: Urology

## 2020-09-23 ENCOUNTER — Other Ambulatory Visit: Payer: Self-pay | Admitting: Nurse Practitioner

## 2020-11-24 ENCOUNTER — Other Ambulatory Visit: Payer: Self-pay | Admitting: Nurse Practitioner

## 2020-11-24 DIAGNOSIS — E785 Hyperlipidemia, unspecified: Secondary | ICD-10-CM

## 2020-12-26 ENCOUNTER — Other Ambulatory Visit: Payer: Self-pay | Admitting: Nurse Practitioner

## 2021-01-19 ENCOUNTER — Ambulatory Visit (INDEPENDENT_AMBULATORY_CARE_PROVIDER_SITE_OTHER): Payer: Federal, State, Local not specified - PPO | Admitting: Nurse Practitioner

## 2021-01-19 ENCOUNTER — Encounter: Payer: Self-pay | Admitting: Nurse Practitioner

## 2021-01-19 ENCOUNTER — Other Ambulatory Visit: Payer: Self-pay

## 2021-01-19 VITALS — BP 114/80 | HR 79 | Temp 99.0°F | Ht 64.4 in | Wt 167.8 lb

## 2021-01-19 DIAGNOSIS — E785 Hyperlipidemia, unspecified: Secondary | ICD-10-CM | POA: Diagnosis not present

## 2021-01-19 DIAGNOSIS — Z23 Encounter for immunization: Secondary | ICD-10-CM | POA: Diagnosis not present

## 2021-01-19 DIAGNOSIS — E039 Hypothyroidism, unspecified: Secondary | ICD-10-CM | POA: Diagnosis not present

## 2021-01-19 DIAGNOSIS — Z6828 Body mass index (BMI) 28.0-28.9, adult: Secondary | ICD-10-CM

## 2021-01-19 DIAGNOSIS — Z2821 Immunization not carried out because of patient refusal: Secondary | ICD-10-CM

## 2021-01-19 MED ORDER — TETANUS-DIPHTH-ACELL PERTUSSIS 5-2.5-18.5 LF-MCG/0.5 IM SUSP
0.5000 mL | Freq: Once | INTRAMUSCULAR | 0 refills | Status: AC
Start: 2021-01-19 — End: 2021-01-19

## 2021-01-19 NOTE — Patient Instructions (Signed)

## 2021-01-19 NOTE — Progress Notes (Signed)
°I, Yamilka J [place],acting as a scribe for Janece Moore, FNP.,have documented all relevant documentation on the behalf of Janece Moore, FNP,as directed by  Janece Moore, FNP while in the presence of Janece Moore, FNP.  ° °This visit occurred during the SARS-CoV-2 public health emergency.  Safety protocols were in place, including screening questions prior to the visit, additional usage of staff PPE, and extensive cleaning of exam room while observing appropriate contact time as indicated for disinfecting solutions. ° °Subjective:  °  ° Patient ID: Samuel [name] , male    DOB: 04/08/1935 , 85 y.o.   MRN: [id number] ° ° °Chief Complaint  °Patient presents with  ° Hyperlipidemia  ° Hypothyroidism  ° ° °HPI ° °Patient presents today for a cholesterol and thyroid check. Patient has no concerns at this time. ° °Wt Readings from Last 3 Encounters: °01/19/21 : 167 lb 12.8 oz (76.1 kg) °07/14/20 : 167 lb 3.2 oz (75.8 kg) °05/20/20 : 168 lb (76.2 kg) °  ° °Hyperlipidemia °This is a chronic problem. The current episode started more than 1 year ago. Recent lipid tests were reviewed and are normal. He has no history of chronic renal disease. There are no known factors aggravating his hyperlipidemia. Pertinent negatives include no chest pain. There are no compliance problems.  There are no known risk factors for coronary artery disease.  °Thyroid Problem °Presents for follow-up visit. Patient reports no fatigue or palpitations. The symptoms have been stable. His past medical history is significant for hyperlipidemia.   ° °Past Medical History:  °Diagnosis Date  ° Hyperlipidemia   ° Hypothyroid   °  ° °History reviewed. No pertinent family history. ° ° °Current Outpatient Medications:  °  atorvastatin (LIPITOR) 20 MG tablet, TAKE 1 TABLET BY MOUTH EVERY DAY, Disp: 90 tablet, Rfl: 1 °  cholecalciferol (VITAMIN D3) 25 MCG (1000 UT) tablet, Take 1,000 Units by mouth daily. Take 3000 units daily, Disp: , Rfl:  °  dutasteride  (AVODART) 0.5 MG capsule, TAKE 1 CAPSULE BY MOUTH EVERY DAY, Disp: 90 capsule, Rfl: 3 °  levothyroxine (SYNTHROID) 100 MCG tablet, TAKE 1 TABLET BY MOUTH EVERY DAY, Disp: 90 tablet, Rfl: 0 °  tadalafil (CIALIS) 5 MG tablet, TAKE 1 TABLET BY MOUTH EVERY DAY, Disp: 30 tablet, Rfl: 11 °  Tdap (BOOSTRIX) 5-2.5-18.5 LF-MCG/0.5 injection, Inject 0.5 mLs into the muscle once for 1 dose., Disp: 0.5 mL, Rfl: 0  ° °No Known Allergies  ° °Review of Systems  °Constitutional: Negative.  Negative for fatigue.  °Respiratory: Negative.  Negative for cough and wheezing.   °Cardiovascular:  Negative for chest pain, palpitations and leg swelling.  °Endocrine: Negative for polydipsia, polyphagia and polyuria.  °Neurological:  Negative for dizziness and headaches.  °Psychiatric/Behavioral: Negative.     ° °Today's Vitals  ° 01/19/21 0841  °BP: 114/80  °Pulse: 79  °Temp: 99 °F (37.2 °C)  °Weight: 167 lb 12.8 oz (76.1 kg)  °Height: 5' 4.4" (1.636 m)  °PainSc: 0-No pain  ° °Body mass index is 28.45 kg/m².  ° °Objective:  °Physical Exam °Vitals reviewed.  °Constitutional:   °   General: He is not in acute distress. °   Appearance: Normal appearance.  °Cardiovascular:  °   Rate and Rhythm: Normal rate and regular rhythm.  °   Pulses: Normal pulses.  °   Heart sounds: Normal heart sounds. No murmur heard. °Pulmonary:  °   Effort: Pulmonary effort is normal. No respiratory distress.  °   Breath   Breath sounds: Normal breath sounds. No wheezing.  Skin:    General: Skin is warm and dry.     Capillary Refill: Capillary refill takes less than 2 seconds.  Neurological:     General: No focal deficit present.     Mental Status: He is alert and oriented to person, place, and time. Mental status is at baseline.     Cranial Nerves: No cranial nerve deficit.     Motor: No weakness.  Psychiatric:        Mood and Affect: Mood normal.        Behavior: Behavior normal.        Thought Content: Thought content normal.        Judgment: Judgment normal.         Assessment And Plan:     1. Hyperlipidemia, unspecified hyperlipidemia type Comments: Controlled, continue current medications. Tolerating well - Lipid panel - CMP14+EGFR  2. Acquired hypothyroidism Comments: Controlled, continue current medications pending lab results - T4, Free - T3, free - TSH  3. Immunization due Rx sent to pharmacy.  TDAP will be administered to adults 36-49 years old every 10 years. - Tdap (BOOSTRIX) 5-2.5-18.5 LF-MCG/0.5 injection; Inject 0.5 mLs into the muscle once for 1 dose.  Dispense: 0.5 mL; Refill: 0  4. BMI 28.0-28.9,adult He is encouraged to strive for BMI less than 25 to decrease cardiac risk. Continue with physical activity  5. Immunization declined Declines shingrix, educated on disease process and is aware if he changes his mind to notify office  Patient was given opportunity to ask questions. Patient verbalized understanding of the plan and was able to repeat key elements of the plan. All questions were answered to their satisfaction.  [name], FNP   I, [name], FNP, have reviewed all documentation for this visit. The documentation on 01/19/21 for the exam, diagnosis, procedures, and orders are all accurate and complete.   IF YOU HAVE BEEN REFERRED TO A SPECIALIST, IT MAY TAKE 1-2 WEEKS TO SCHEDULE/PROCESS THE REFERRAL. IF YOU HAVE NOT HEARD FROM US/SPECIALIST IN TWO WEEKS, PLEASE GIVE [place] A CALL AT [phone].   THE PATIENT IS ENCOURAGED TO PRACTICE SOCIAL DISTANCING DUE TO THE COVID-19 PANDEMIC.

## 2021-01-20 LAB — LIPID PANEL
Chol/HDL Ratio: 4.2 ratio (ref 0.0–5.0)
Cholesterol, Total: 166 mg/dL (ref 100–199)
HDL: 40 mg/dL (ref >39)
LDL Chol Calc (NIH): 107 mg/dL — ABNORMAL HIGH (ref 0–99)
Triglycerides: 101 mg/dL (ref 0–149)
VLDL Cholesterol Cal: 19 mg/dL (ref 5–40)

## 2021-01-20 LAB — CMP14+EGFR
ALT: 33 IU/L (ref 0–44)
AST: 28 IU/L (ref 0–40)
Albumin/Globulin Ratio: 1.8 (ref 1.2–2.2)
Albumin: 4.4 g/dL (ref 3.6–4.6)
Alkaline Phosphatase: 73 IU/L (ref 44–121)
BUN/Creatinine Ratio: 9 — ABNORMAL LOW (ref 10–24)
BUN: 11 mg/dL (ref 8–27)
Bilirubin Total: 0.5 mg/dL (ref 0.0–1.2)
CO2: 22 mmol/L (ref 20–29)
Calcium: 9.8 mg/dL (ref 8.6–10.2)
Chloride: 104 mmol/L (ref 96–106)
Creatinine, Ser: 1.23 mg/dL (ref 0.76–1.27)
Globulin, Total: 2.4 g/dL (ref 1.5–4.5)
Glucose: 121 mg/dL — ABNORMAL HIGH (ref 70–99)
Potassium: 4.2 mmol/L (ref 3.5–5.2)
Sodium: 141 mmol/L (ref 134–144)
Total Protein: 6.8 g/dL (ref 6.0–8.5)
eGFR: 58 mL/min/{1.73_m2} — ABNORMAL LOW (ref >59)

## 2021-01-20 LAB — T4, FREE: Free T4: 1.29 ng/dL (ref 0.82–1.77)

## 2021-01-20 LAB — T3, FREE: T3, Free: 2.1 pg/mL (ref 2.0–4.4)

## 2021-01-20 LAB — TSH: TSH: 1.31 u[IU]/mL (ref 0.450–4.500)

## 2021-03-29 ENCOUNTER — Other Ambulatory Visit: Payer: Self-pay | Admitting: Nurse Practitioner

## 2021-05-19 ENCOUNTER — Other Ambulatory Visit: Payer: Self-pay | Admitting: Nurse Practitioner

## 2021-05-19 DIAGNOSIS — E785 Hyperlipidemia, unspecified: Secondary | ICD-10-CM

## 2021-06-30 ENCOUNTER — Other Ambulatory Visit: Payer: Self-pay | Admitting: Nurse Practitioner

## 2021-07-20 ENCOUNTER — Ambulatory Visit (INDEPENDENT_AMBULATORY_CARE_PROVIDER_SITE_OTHER): Payer: Federal, State, Local not specified - PPO | Admitting: Nurse Practitioner

## 2021-07-20 ENCOUNTER — Encounter: Payer: Self-pay | Admitting: Nurse Practitioner

## 2021-07-20 VITALS — BP 122/70 | HR 64 | Temp 97.8°F | Wt 165.6 lb

## 2021-07-20 DIAGNOSIS — N400 Enlarged prostate without lower urinary tract symptoms: Secondary | ICD-10-CM

## 2021-07-20 DIAGNOSIS — E7800 Pure hypercholesterolemia, unspecified: Secondary | ICD-10-CM

## 2021-07-20 DIAGNOSIS — E039 Hypothyroidism, unspecified: Secondary | ICD-10-CM

## 2021-07-20 DIAGNOSIS — Z6828 Body mass index (BMI) 28.0-28.9, adult: Secondary | ICD-10-CM

## 2021-07-20 DIAGNOSIS — Z23 Encounter for immunization: Secondary | ICD-10-CM

## 2021-07-20 NOTE — Patient Instructions (Addendum)
High Cholesterol  High cholesterol is a condition in which the blood has high levels of a white, waxy substance similar to fat (cholesterol). The liver makes all the cholesterol that the body needs. The human body needs small amounts of cholesterol to help build cells. A person gets extra or excess cholesterol from the food that he or she eats. The blood carries cholesterol from the liver to the rest of the body. If you have high cholesterol, deposits (plaques) may build up on the walls of your arteries. Arteries are the blood vessels that carry blood away from your heart. These plaques make the arteries narrow and stiff. Cholesterol plaques increase your risk for heart attack and stroke. Work with your health care provider to keep your cholesterol levels in a healthy range. What increases the risk? The following factors may make you more likely to develop this condition: Eating foods that are high in animal fat (saturated fat) or cholesterol. Being overweight. Not getting enough exercise. A family history of high cholesterol (familial hypercholesterolemia). Use of tobacco products. Having diabetes. What are the signs or symptoms? In most cases, high cholesterol does not usually cause any symptoms. In severe cases, very high cholesterol levels can cause: Fatty bumps under the skin (xanthomas). A white or gray ring around the black center (pupil) of the eye. How is this diagnosed? This condition may be diagnosed based on the results of a blood test. If you are older than 84 years of age, your health care provider may check your cholesterol levels every 4-6 years. You may be checked more often if you have high cholesterol or other risk factors for heart disease. The blood test for cholesterol measures: "Bad" cholesterol, or LDL cholesterol. This is the main type of cholesterol that causes heart disease. The desired level is less than 100 mg/dL (2.59 mmol/L). "Good" cholesterol, or HDL  cholesterol. HDL helps protect against heart disease by cleaning the arteries and carrying the LDL to the liver for processing. The desired level for HDL is 60 mg/dL (1.55 mmol/L) or higher. Triglycerides. These are fats that your body can store or burn for energy. The desired level is less than 150 mg/dL (1.69 mmol/L). Total cholesterol. This measures the total amount of cholesterol in your blood and includes LDL, HDL, and triglycerides. The desired level is less than 200 mg/dL (5.17 mmol/L). How is this treated? Treatment for high cholesterol starts with lifestyle changes, such as diet and exercise. Diet changes. You may be asked to eat foods that have more fiber and less saturated fats or added sugar. Lifestyle changes. These may include regular exercise, maintaining a healthy weight, and quitting use of tobacco products. Medicines. These are given when diet and lifestyle changes have not worked. You may be prescribed a statin medicine to help lower your cholesterol levels. Follow these instructions at home: Eating and drinking  Eat a healthy, balanced diet. This diet includes: Daily servings of a variety of fresh, frozen, or canned fruits and vegetables. Daily servings of whole grain foods that are rich in fiber. Foods that are low in saturated fats and trans fats. These include poultry and fish without skin, lean cuts of meat, and low-fat dairy products. A variety of fish, especially oily fish that contain omega-3 fatty acids. Aim to eat fish at least 2 times a week. Avoid foods and drinks that have added sugar. Use healthy cooking methods, such as roasting, grilling, broiling, baking, poaching, steaming, and stir-frying. Do not fry your food except for   stir-frying. If you drink alcohol: Limit how much you have to: 0-1 drink a day for women who are not pregnant. 0-2 drinks a day for men. Know how much alcohol is in a drink. In the [place] one drink equals one 12 oz bottle of beer (355 mL),  one 5 oz glass of wine (148 mL), or one 1 oz glass of hard liquor (44 mL). Lifestyle  Get regular exercise. Aim to exercise for a total of 150 minutes a week. Increase your activity level by doing activities such as gardening, walking, and taking the stairs. Do not use any products that contain nicotine or tobacco. These products include cigarettes, chewing tobacco, and vaping devices, such as e-cigarettes. If you need help quitting, ask your health care provider. General instructions Take over-the-counter and prescription medicines only as told by your health care provider. Keep all follow-up visits. This is important. Where to find more information American Heart Association: www.heart.org National Heart, Lung, and Blood Institute: PopSteam.is Contact a health care provider if: You have trouble achieving or maintaining a healthy diet or weight. You are starting an exercise program. You are unable to stop smoking. Get help right away if: You have chest pain. You have trouble breathing. You have discomfort or pain in your jaw, neck, back, shoulder, or arm. You have any symptoms of a stroke. "BE FAST" is an easy way to remember the main warning signs of a stroke: B - Balance. Signs are dizziness, sudden trouble walking, or loss of balance. E - Eyes. Signs are trouble seeing or a sudden change in vision. F - Face. Signs are sudden weakness or numbness of the face, or the face or eyelid drooping on one side. A - Arms. Signs are weakness or numbness in an arm. This happens suddenly and usually on one side of the body. S - Speech. Signs are sudden trouble speaking, slurred speech, or trouble understanding what people say. T - Time. Time to call emergency services. Write down what time symptoms started. You have other signs of a stroke, such as: A sudden, severe headache with no known cause. Nausea or vomiting. Seizure. These symptoms may represent a serious problem that is an  emergency. Do not wait to see if the symptoms will go away. Get medical help right away. Call your local emergency services (911 in the [place] Do not drive yourself to the hospital. Summary Cholesterol plaques increase your risk for heart attack and stroke. Work with your health care provider to keep your cholesterol levels in a healthy range. Eat a healthy, balanced diet, get regular exercise, and maintain a healthy weight. Do not use any products that contain nicotine or tobacco. These products include cigarettes, chewing tobacco, and vaping devices, such as e-cigarettes. Get help right away if you have any symptoms of a stroke. This information is not intended to replace advice given to you by your health care provider. Make sure you discuss any questions you have with your health care provider. Document Revised: 03/25/2020 Document Reviewed: 03/15/2020 Elsevier Patient Education  2023 Elsevier Inc.  Hyperthyroidism  Hyperthyroidism is when the thyroid gland is too active (overactive). The thyroid gland is a small gland located in the lower front part of the neck, just in front of the windpipe (trachea). This gland makes hormones that help control how the body uses food for energy (metabolism) as well as how the heart and brain function. These hormones also play a role in keeping your bones strong. When the thyroid is  overactive, it produces too much of a hormone called thyroxine. What are the causes? This condition may be caused by: Graves' disease. This is a disorder in which the body's disease-fighting system (immune system) attacks the thyroid gland. This is the most common cause. Inflammation of the thyroid gland. A tumor in the thyroid gland. Use of certain medicines, including: Prescription thyroid hormone replacement. Herbal supplements that mimic thyroid hormones. Amiodarone therapy. Solid or fluid-filled lumps within your thyroid gland (thyroid nodules). Taking in a large amount  of iodine from foods or medicines. What increases the risk? You are more likely to develop this condition if: You are male. You have a family history of thyroid conditions. You smoke tobacco. You use a medicine called lithium. You take medicines that affect the immune system (immunosuppressants). What are the signs or symptoms? Symptoms of this condition include: Nervousness. Inability to tolerate heat. Unexplained weight loss. Diarrhea. Change in the texture of hair or skin. Heart skipping beats or making extra beats. Rapid heart rate. Loss of menstruation. Shaky hands. Fatigue. Restlessness. Sleep problems. Enlarged thyroid gland or a lump in the thyroid (nodule). You may also have symptoms of Graves' disease, which may include: Protruding eyes. Dry eyes. Red or swollen eyes. Problems with vision. How is this diagnosed? This condition may be diagnosed based on: Your symptoms and medical history. A physical exam. Blood tests. Thyroid ultrasound. This test involves using sound waves to produce images of the thyroid gland. A thyroid scan. A radioactive substance is injected into a vein, and images show how much iodine is present in the thyroid. Radioactive iodine uptake test (RAIU). A small amount of radioactive iodine is given by mouth to see how much iodine the thyroid absorbs after a certain amount of time. How is this treated? Treatment depends on the cause and severity of the condition. Treatment may include: Medicines to reduce the amount of thyroid hormone your body makes. Radioactive iodine treatment (radioiodine therapy). This involves swallowing a small dose of radioactive iodine, in capsule or liquid form, to kill thyroid cells. Surgery to remove part or all of your thyroid gland. You may need to take thyroid hormone replacement medicine for the rest of your life after thyroid surgery. Medicines to help manage your symptoms. Follow these instructions at  home:  Take over-the-counter and prescription medicines only as told by your health care provider. Do not use any products that contain nicotine or tobacco, such as cigarettes and e-cigarettes. If you need help quitting, ask your health care provider. Follow any instructions from your health care provider about diet. You may be instructed to limit foods that contain iodine. Keep all follow-up visits as told by your health care provider. This is important. You will need to have blood tests regularly so that your health care provider can monitor your condition. Contact a health care provider if: Your symptoms do not get better with treatment. You have a fever. You are taking thyroid hormone replacement medicine and you: Have symptoms of depression. Feel like you are tired all the time. Gain weight. Get help right away if: You have chest pain. You have decreased alertness or a change in your awareness. You have abdominal pain. You feel dizzy. You have a rapid heartbeat. You have an irregular heartbeat. You have difficulty breathing. Summary The thyroid gland is a small gland located in the lower front part of the neck, just in front of the windpipe (trachea). Hyperthyroidism is when the thyroid gland is too active (overactive) and  produces too much of a hormone called thyroxine. The most common cause is Graves' disease, a disorder in which your immune system attacks the thyroid gland. Hyperthyroidism can cause various symptoms, such as unexplained weight loss, nervousness, inability to tolerate heat, or changes in your heartbeat. Treatment may include medicine to reduce the amount of thyroid hormone your body makes, radioiodine therapy, surgery, or medicines to manage symptoms. This information is not intended to replace advice given to you by your health care provider. Make sure you discuss any questions you have with your health care provider. Document Revised: 01/21/2021 Document  Reviewed: 09/25/2019 Elsevier Patient Education  2023 Elsevier Inc.   Tdap (Tetanus, Diphtheria, Pertussis) Vaccine: What You Need to Know 1. Why get vaccinated? Tdap vaccine can prevent tetanus, diphtheria, and pertussis. Diphtheria and pertussis spread from person to person. Tetanus enters the body through cuts or wounds. TETANUS (T) causes painful stiffening of the muscles. Tetanus can lead to serious health problems, including being unable to open the mouth, having trouble swallowing and breathing, or death. DIPHTHERIA (D) can lead to difficulty breathing, heart failure, paralysis, or death. PERTUSSIS (aP), also known as "whooping cough," can cause uncontrollable, violent coughing that makes it hard to breathe, eat, or drink. Pertussis can be extremely serious especially in babies and young children, causing pneumonia, convulsions, brain damage, or death. In teens and adults, it can cause weight loss, loss of bladder control, passing out, and rib fractures from severe coughing. 2. Tdap vaccine Tdap is only for children 7 years and older, adolescents, and adults.  Adolescents should receive a single dose of Tdap, preferably at age 85 or 12 years. Pregnant people should get a dose of Tdap during every pregnancy, preferably during the early part of the third trimester, to help protect the newborn from pertussis. Infants are most at risk for severe, life-threatening complications from pertussis. Adults who have never received Tdap should get a dose of Tdap. Also, adults should receive a booster dose of either Tdap or Td (a different vaccine that protects against tetanus and diphtheria but not pertussis) every 10 years, or after 5 years in the case of a severe or dirty wound or burn. Tdap may be given at the same time as other vaccines. 3. Talk with your health care provider Tell your vaccine provider if the person getting the vaccine: Has had an allergic reaction after a previous dose of any  vaccine that protects against tetanus, diphtheria, or pertussis, or has any severe, life-threatening allergies Has had a coma, decreased level of consciousness, or prolonged seizures within 7 days after a previous dose of any pertussis vaccine (DTP, DTaP, or Tdap) Has seizures or another nervous system problem Has ever had Guillain-Barr Syndrome (also called "GBS") Has had severe pain or swelling after a previous dose of any vaccine that protects against tetanus or diphtheria In some cases, your health care provider may decide to postpone Tdap vaccination until a future visit. People with minor illnesses, such as a cold, may be vaccinated. People who are moderately or severely ill should usually wait until they recover before getting Tdap vaccine.  Your health care provider can give you more information. 4. Risks of a vaccine reaction Pain, redness, or swelling where the shot was given, mild fever, headache, feeling tired, and nausea, vomiting, diarrhea, or stomachache sometimes happen after Tdap vaccination. People sometimes faint after medical procedures, including vaccination. Tell your provider if you feel dizzy or have vision changes or ringing in the ears.  As with any medicine, there is a very remote chance of a vaccine causing a severe allergic reaction, other serious injury, or death. 5. What if there is a serious problem? An allergic reaction could occur after the vaccinated person leaves the clinic. If you see signs of a severe allergic reaction (hives, swelling of the face and throat, difficulty breathing, a fast heartbeat, dizziness, or weakness), call 9-1-1 and get the person to the nearest hospital. For other signs that concern you, call your health care provider.  Adverse reactions should be reported to the Vaccine Adverse Event Reporting System (VAERS). Your health care provider will usually file this report, or you can do it yourself. Visit the VAERS website at www.vaers.SamedayNews.es or  call 401-129-7569. VAERS is only for reporting reactions, and VAERS staff members do not give medical advice. 6. The National Vaccine Injury Compensation Program The [employer] Vaccine Injury Compensation Program (VICP) is a federal program that was created to compensate people who may have been injured by certain vaccines. Claims regarding alleged injury or death due to vaccination have a time limit for filing, which may be as short as two years. Visit the VICP website at GoldCloset.com.ee or call 305-003-8384 to learn about the program and about filing a claim. 7. How can I learn more? Ask your health care provider. Call your local or state health department. Visit the website of the Food and Drug Administration (FDA) for vaccine package inserts and additional information at TraderRating.uy. Contact the Centers for Disease Control and Prevention (CDC): Call 240-646-1584 (1-800-CDC-INFO) or Visit CDC's website at http://hunter.com/. Source: CDC Vaccine Information Statement Tdap (Tetanus, Diphtheria, Pertussis) Vaccine (08/29/2019) This same material is available at http://www.wolf.info/ for no charge. This information is not intended to replace advice given to you by your health care provider. Make sure you discuss any questions you have with your health care provider. Document Revised: 12/08/2020 Document Reviewed: 10/11/2020 Elsevier Patient Education  [place].

## 2021-07-20 NOTE — Progress Notes (Signed)
I,Victoria T Hamilton,acting as a Education administrator for [name], FNP.,have documented all relevant documentation on the behalf of [name], FNP,as directed by  [name], FNP while in the presence of [name], [place].   This visit occurred during the SARS-CoV-2 public health emergency.  Safety protocols were in place, including screening questions prior to the visit, additional usage of staff PPE, and extensive cleaning of exam room while observing appropriate contact time as indicated for disinfecting solutions.  Subjective:     Patient ID: Samuel [name] , male    DOB: [birth year] , 86 y.o.   MRN: [id number]   Chief Complaint  Patient presents with   Hyperlipidemia   Hypothyroidism    HPI  Here for follow up for cholesterol and hypothyroid. He is exercising a little. Continues to see Urology for enlarged prostate.      Past Medical History:  Diagnosis Date   Hyperlipidemia    Hypothyroid      History reviewed. No pertinent family history.   Current Outpatient Medications:    atorvastatin (LIPITOR) 20 MG tablet, TAKE 1 TABLET BY MOUTH EVERY DAY, Disp: 90 tablet, Rfl: 1   cholecalciferol (VITAMIN D3) 25 MCG (1000 UT) tablet, Take 1,000 Units by mouth daily. Take 3000 units daily, Disp: , Rfl:    levothyroxine (SYNTHROID) 100 MCG tablet, TAKE 1 TABLET BY MOUTH EVERY DAY, Disp: 90 tablet, Rfl: 1   tadalafil (CIALIS) 5 MG tablet, TAKE 1 TABLET BY MOUTH EVERY DAY, Disp: 30 tablet, Rfl: 11   dutasteride (AVODART) 0.5 MG capsule, TAKE 1 CAPSULE BY MOUTH EVERY DAY (Patient not taking: Reported on 07/20/2021), Disp: 90 capsule, Rfl: 3   No Known Allergies   Review of Systems  Constitutional: Negative.  Negative for fatigue.  Respiratory: Negative.  Negative for cough and wheezing.   Cardiovascular:  Negative for chest pain, palpitations and leg swelling.  Endocrine: Negative for polydipsia, polyphagia and polyuria.  Neurological:  Negative for dizziness and headaches.   Psychiatric/Behavioral: Negative.       Today's Vitals   07/20/21 1032  BP: 122/70  Pulse: 64  Temp: 97.8 F (36.6 C)  SpO2: 96%  Weight: 165 lb 9.6 oz (75.1 kg)   Body mass index is 28.07 kg/m.  Wt Readings from Last 3 Encounters:  07/20/21 165 lb 9.6 oz (75.1 kg)  01/19/21 167 lb 12.8 oz (76.1 kg)  07/14/20 167 lb 3.2 oz (75.8 kg)    Objective:  Physical Exam Vitals reviewed.  Constitutional:      General: He is not in acute distress.    Appearance: Normal appearance.  Cardiovascular:     Rate and Rhythm: Normal rate and regular rhythm.     Pulses: Normal pulses.     Heart sounds: Normal heart sounds. No murmur heard. Pulmonary:     Effort: Pulmonary effort is normal. No respiratory distress.     Breath sounds: Normal breath sounds. No wheezing.  Skin:    General: Skin is warm and dry.     Capillary Refill: Capillary refill takes less than 2 seconds.  Neurological:     General: No focal deficit present.     Mental Status: He is alert and oriented to person, place, and time. Mental status is at baseline.     Cranial Nerves: No cranial nerve deficit.     Motor: No weakness.  Psychiatric:        Mood and Affect: Mood normal.        Behavior: Behavior normal.  Thought Content: Thought content normal.        Judgment: Judgment normal.         Assessment And Plan:     1. Elevated LDL cholesterol level Comments: Diet controlled, continue current medications - CMP14+EGFR - Lipid panel - CBC  2. Acquired hypothyroidism Comments: Controlled, continue current medications.  - CMP14+EGFR - CBC - TSH - T4, free - T3, free  3. Benign prostatic hyperplasia without lower urinary tract symptoms Comments: Continue follow up with Urology yearly  4. Immunization due Will give tetanus vaccine today while in office. Refer to order management. TDAP will be administered to adults 45-48 years old every 10 years. - Tdap vaccine greater than or equal to 7yo  IM  5. BMI 28.0-28.9,adult Continue regular exercise as tolerated   Patient was given opportunity to ask questions. Patient verbalized understanding of the plan and was able to repeat key elements of the plan. All questions were answered to their satisfaction.  [name], FNP   I, [name], FNP, have reviewed all documentation for this visit. The documentation on 07/20/21 for the exam, diagnosis, procedures, and orders are all accurate and complete.   IF YOU HAVE BEEN REFERRED TO A SPECIALIST, IT MAY TAKE 1-2 WEEKS TO SCHEDULE/PROCESS THE REFERRAL. IF YOU HAVE NOT HEARD FROM US/SPECIALIST IN TWO WEEKS, PLEASE GIVE [place] A CALL AT [phone] X [phone].   THE PATIENT IS ENCOURAGED TO PRACTICE SOCIAL DISTANCING DUE TO THE COVID-19 PANDEMIC.

## 2021-07-21 LAB — CMP14+EGFR
ALT: 23 IU/L (ref 0–44)
AST: 22 IU/L (ref 0–40)
Albumin/Globulin Ratio: 1.7 (ref 1.2–2.2)
Albumin: 4.3 g/dL (ref 3.6–4.6)
Alkaline Phosphatase: 70 IU/L (ref 44–121)
BUN/Creatinine Ratio: 7 — ABNORMAL LOW (ref 10–24)
BUN: 8 mg/dL (ref 8–27)
Bilirubin Total: 0.6 mg/dL (ref 0.0–1.2)
CO2: 25 mmol/L (ref 20–29)
Calcium: 9.9 mg/dL (ref 8.6–10.2)
Chloride: 102 mmol/L (ref 96–106)
Creatinine, Ser: 1.21 mg/dL (ref 0.76–1.27)
Globulin, Total: 2.5 g/dL (ref 1.5–4.5)
Glucose: 105 mg/dL — ABNORMAL HIGH (ref 70–99)
Potassium: 4.5 mmol/L (ref 3.5–5.2)
Sodium: 139 mmol/L (ref 134–144)
Total Protein: 6.8 g/dL (ref 6.0–8.5)
eGFR: 59 mL/min/{1.73_m2} — ABNORMAL LOW (ref >59)

## 2021-07-21 LAB — LIPID PANEL
Chol/HDL Ratio: 3.8 ratio (ref 0.0–5.0)
Cholesterol, Total: 165 mg/dL (ref 100–199)
HDL: 43 mg/dL (ref >39)
LDL Chol Calc (NIH): 105 mg/dL — ABNORMAL HIGH (ref 0–99)
Triglycerides: 92 mg/dL (ref 0–149)
VLDL Cholesterol Cal: 17 mg/dL (ref 5–40)

## 2021-07-21 LAB — T3, FREE: T3, Free: 2.3 pg/mL (ref 2.0–4.4)

## 2021-07-21 LAB — CBC
Hematocrit: 38.3 % (ref 37.5–51.0)
Hemoglobin: 13.1 g/dL (ref 13.0–17.7)
MCH: 32.2 pg (ref 26.6–33.0)
MCHC: 34.2 g/dL (ref 31.5–35.7)
MCV: 94 fL (ref 79–97)
Platelets: 201 10*3/uL (ref 150–450)
RBC: 4.07 x10E6/uL — ABNORMAL LOW (ref 4.14–5.80)
RDW: 10.8 % — ABNORMAL LOW (ref 11.6–15.4)
WBC: 5.7 10*3/uL (ref 3.4–10.8)

## 2021-07-21 LAB — TSH: TSH: 1.52 u[IU]/mL (ref 0.450–4.500)

## 2021-07-21 LAB — T4, FREE: Free T4: 1.45 ng/dL (ref 0.82–1.77)

## 2021-08-19 ENCOUNTER — Other Ambulatory Visit: Payer: Self-pay | Admitting: Urology

## 2021-11-01 ENCOUNTER — Ambulatory Visit (INDEPENDENT_AMBULATORY_CARE_PROVIDER_SITE_OTHER): Payer: Federal, State, Local not specified - PPO | Admitting: Nurse Practitioner

## 2021-11-01 ENCOUNTER — Encounter: Payer: Self-pay | Admitting: Nurse Practitioner

## 2021-11-01 VITALS — BP 128/80 | HR 70 | Temp 97.9°F | Ht 64.4 in | Wt 164.0 lb

## 2021-11-01 DIAGNOSIS — R051 Acute cough: Secondary | ICD-10-CM | POA: Diagnosis not present

## 2021-11-01 DIAGNOSIS — R067 Sneezing: Secondary | ICD-10-CM | POA: Diagnosis not present

## 2021-11-01 NOTE — Progress Notes (Signed)
I,Neila Teem,acting as a Neurosurgeon for Arnette Felts, FNP.,have documented all relevant documentation on the behalf of Arnette Felts, FNP,as directed by  Arnette Felts, FNP while in the presence of Arnette Felts, FNP.    Subjective:     Patient ID: Samuel [name] , male    DOB: 09-24-1935 , 86 y.o.   MRN: [id number]   Chief Complaint  Patient presents with   Nasal Congestion    HPI  Here for cold symptoms coughing and sneezing. Feels like his lungs are congested. He has taken advil today for his symptoms. Denies shortness of breath     Past Medical History:  Diagnosis Date   Hyperlipidemia    Hypothyroid      History reviewed. No pertinent family history.   Current Outpatient Medications:    cholecalciferol (VITAMIN D3) 25 MCG (1000 UT) tablet, Take 1,000 Units by mouth daily. Take 3000 units daily, Disp: , Rfl:    dutasteride (AVODART) 0.5 MG capsule, TAKE 1 CAPSULE BY MOUTH EVERY DAY, Disp: 90 capsule, Rfl: 3   levothyroxine (SYNTHROID) 100 MCG tablet, TAKE 1 TABLET BY MOUTH EVERY DAY, Disp: 90 tablet, Rfl: 1   tadalafil (CIALIS) 5 MG tablet, TAKE 1 TABLET BY MOUTH EVERY DAY, Disp: 30 tablet, Rfl: 11   atorvastatin (LIPITOR) 20 MG tablet, TAKE 1 TABLET BY MOUTH EVERY DAY, Disp: 90 tablet, Rfl: 1   No Known Allergies   Review of Systems  Constitutional: Negative.   HENT:  Positive for congestion and rhinorrhea.   Respiratory:  Positive for cough.   Neurological: Negative.   Psychiatric/Behavioral: Negative.       Today's Vitals   11/01/21 1527  BP: 128/80  Pulse: 70  Temp: 97.9 F (36.6 C)  TempSrc: Oral  Weight: 164 lb (74.4 kg)  Height: 5' 4.4" (1.636 m)   Body mass index is 27.8 kg/m.   Objective:  Physical Exam Vitals reviewed.  Constitutional:      General: He is not in acute distress.    Appearance: Normal appearance.  Cardiovascular:     Rate and Rhythm: Normal rate and regular rhythm.     Pulses: Normal pulses.     Heart sounds: Normal heart  sounds. No murmur heard. Pulmonary:     Effort: Pulmonary effort is normal. No respiratory distress.     Breath sounds: Normal breath sounds. No wheezing.     Comments: Secretions cleared after coughing  Skin:    General: Skin is warm and dry.     Capillary Refill: Capillary refill takes less than 2 seconds.  Neurological:     General: No focal deficit present.     Mental Status: He is alert and oriented to person, place, and time. Mental status is at baseline.     Cranial Nerves: No cranial nerve deficit.     Motor: No weakness.  Psychiatric:        Mood and Affect: Mood normal.        Behavior: Behavior normal.        Thought Content: Thought content normal.        Judgment: Judgment normal.         Assessment And Plan:     1. Acute cough Comments: Negative rapid covid, will send PCR. Encouraged to take Delsym as needed for cough - Novel Coronavirus, NAA ([facility])  2. Sneezing Comments: Advised to take an over the counter antihistamine as needed.  - Novel Coronavirus, NAA ([facility])     Patient was given opportunity  to ask questions. Patient verbalized understanding of the plan and was able to repeat key elements of the plan. All questions were answered to their satisfaction.  [name], FNP   I, [name], FNP, have reviewed all documentation for this visit. The documentation on 11/14/21 for the exam, diagnosis, procedures, and orders are all accurate and complete.   IF YOU HAVE BEEN REFERRED TO A SPECIALIST, IT MAY TAKE 1-2 WEEKS TO SCHEDULE/PROCESS THE REFERRAL. IF YOU HAVE NOT HEARD FROM US/SPECIALIST IN TWO WEEKS, PLEASE GIVE [place] A CALL AT [phone] X [phone].   THE PATIENT IS ENCOURAGED TO PRACTICE SOCIAL DISTANCING DUE TO THE COVID-19 PANDEMIC.

## 2021-11-01 NOTE — Patient Instructions (Signed)

## 2021-11-03 ENCOUNTER — Ambulatory Visit: Payer: Federal, State, Local not specified - PPO | Admitting: Nurse Practitioner

## 2021-11-14 ENCOUNTER — Other Ambulatory Visit: Payer: Self-pay | Admitting: Nurse Practitioner

## 2021-11-14 DIAGNOSIS — E785 Hyperlipidemia, unspecified: Secondary | ICD-10-CM

## 2021-12-23 ENCOUNTER — Other Ambulatory Visit: Payer: Self-pay | Admitting: Nurse Practitioner

## 2022-01-10 ENCOUNTER — Encounter: Payer: Self-pay | Admitting: Nurse Practitioner

## 2022-01-10 ENCOUNTER — Ambulatory Visit (INDEPENDENT_AMBULATORY_CARE_PROVIDER_SITE_OTHER): Payer: Federal, State, Local not specified - PPO | Admitting: Nurse Practitioner

## 2022-01-10 VITALS — BP 118/64 | HR 64 | Temp 98.2°F | Ht 64.4 in | Wt 164.0 lb

## 2022-01-10 DIAGNOSIS — E785 Hyperlipidemia, unspecified: Secondary | ICD-10-CM

## 2022-01-10 DIAGNOSIS — E039 Hypothyroidism, unspecified: Secondary | ICD-10-CM

## 2022-01-10 DIAGNOSIS — N289 Disorder of kidney and ureter, unspecified: Secondary | ICD-10-CM | POA: Diagnosis not present

## 2022-01-10 NOTE — Patient Instructions (Signed)
Dyslipidemia Dyslipidemia is an imbalance of waxy, fat-like substances (lipids) in the blood. The body needs lipids in small amounts. Dyslipidemia often involves a high level of cholesterol or triglycerides, which are types of lipids. Common forms of dyslipidemia include: High levels of LDL cholesterol. LDL is the type of cholesterol that causes fatty deposits (plaques) to build up in the blood vessels that carry blood away from the heart (arteries). Low levels of HDL cholesterol. HDL cholesterol is the type of cholesterol that protects against heart disease. High levels of HDL remove the LDL buildup from arteries. High levels of triglycerides. Triglycerides are a fatty substance in the blood that is linked to a buildup of plaques in the arteries. What are the causes? There are two main types of dyslipidemia: primary and secondary. Primary dyslipidemia is caused by changes (mutations) in genes that are passed down through families (inherited). These mutations cause several types of dyslipidemia. Secondary dyslipidemia may be caused by various risk factors that can lead to the disease, such as lifestyle choices and certain medical conditions. What increases the risk? You are more likely to develop this condition if you are an older man or if you are a woman who has gone through menopause. Other risk factors include: Having a family history of dyslipidemia. Taking certain medicines, including birth control pills, steroids, some diuretics, and beta-blockers. Eating a diet high in saturated fat. Smoking cigarettes or excessive alcohol intake. Having certain medical conditions such as diabetes, polycystic ovary syndrome (PCOS), kidney disease, liver disease, or hypothyroidism. Not exercising regularly. Being overweight or obese with too much belly fat. What are the signs or symptoms? In most cases, dyslipidemia does not usually cause any symptoms. In severe cases, very high lipid levels can  cause: Fatty bumps under the skin (xanthomas). A white or gray ring around the black center (pupil) of the eye. Very high triglyceride levels can cause inflammation of the pancreas (pancreatitis). How is this diagnosed? Your health care provider may diagnose dyslipidemia based on a routine blood test (fasting blood test). Because most people do not have symptoms of the condition, this blood testing (lipid profile) is done on adults age 20 and older and is repeated every 4-6 years. This test checks: Total cholesterol. This measures the total amount of cholesterol in your blood, including LDL cholesterol, HDL cholesterol, and triglycerides. A healthy number is below 200 mg/dL (5.17 mmol/L). LDL cholesterol. The target number for LDL cholesterol is different for each person, depending on individual risk factors. A healthy number is usually below 100 mg/dL (2.59 mmol/L). Ask your health care provider what your LDL cholesterol should be. HDL cholesterol. An HDL level of 60 mg/dL (1.55 mmol/L) or higher is best because it helps to protect against heart disease. A number below 40 mg/dL (1.03 mmol/L) for men or below 50 mg/dL (1.29 mmol/L) for women increases the risk for heart disease. Triglycerides. A healthy triglyceride number is below 150 mg/dL (1.69 mmol/L). If your lipid profile is abnormal, your health care provider may do other blood tests. How is this treated? Treatment depends on the type of dyslipidemia that you have and your other risk factors for heart disease and stroke. Your health care provider will have a target range for your lipid levels based on this information. Treatment for dyslipidemia starts with lifestyle changes, such as diet and exercise. Your health care provider may recommend that you: Get regular exercise. Make changes to your diet. Quit smoking if you smoke. Limit your alcohol intake. If diet   changes and exercise do not help you reach your goals, your health care provider  may also prescribe medicine to lower lipids. The most commonly prescribed type of medicine lowers your LDL cholesterol (statin drug). If you have a high triglyceride level, your provider may prescribe another type of drug (fibrate) or an omega-3 fish oil supplement, or both. Follow these instructions at home: Eating and drinking  Follow instructions from your health care provider or dietitian about eating or drinking restrictions. Eat a healthy diet as told by your health care provider. This can help you reach and maintain a healthy weight, lower your LDL cholesterol, and raise your HDL cholesterol. This may include: Limiting your calories, if you are overweight. Eating more fruits, vegetables, whole grains, fish, and lean meats. Limiting saturated fat, trans fat, and cholesterol. Do not drink alcohol if: Your health care provider tells you not to drink. You are pregnant, may be pregnant, or are planning to become pregnant. If you drink alcohol: Limit how much you have to: 0-1 drink a day for women. 0-2 drinks a day for men. Know how much alcohol is in your drink. In the [place] one drink equals one 12 oz bottle of beer (355 mL), one 5 oz glass of wine (148 mL), or one 1 oz glass of hard liquor (44 mL). Activity Get regular exercise. Start an exercise and strength training program as told by your health care provider. Ask your health care provider what activities are safe for you. Your health care provider may recommend: 30 minutes of aerobic activity 4-6 days a week. Brisk walking is an example of aerobic activity. Strength training 2 days a week. General instructions Do not use any products that contain nicotine or tobacco. These products include cigarettes, chewing tobacco, and vaping devices, such as e-cigarettes. If you need help quitting, ask your health care provider. Take over-the-counter and prescription medicines only as told by your health care provider. This includes  supplements. Keep all follow-up visits. This is important. Contact a health care provider if: You are having trouble sticking to your exercise or diet plan. You are struggling to quit smoking or to control your use of alcohol. Summary Dyslipidemia often involves a high level of cholesterol or triglycerides, which are types of lipids. Treatment depends on the type of dyslipidemia that you have and your other risk factors for heart disease and stroke. Treatment for dyslipidemia starts with lifestyle changes, such as diet and exercise. Your health care provider may prescribe medicine to lower lipids. This information is not intended to replace advice given to you by your health care provider. Make sure you discuss any questions you have with your health care provider. Document Revised: 08/12/2021 Document Reviewed: 03/15/2020 Elsevier Patient Education  2023 Elsevier Inc.  

## 2022-01-10 NOTE — Progress Notes (Signed)
I,Tianna Badgett,acting as a Education administrator for [employer], FNP.,have documented all relevant documentation on the behalf of [name], FNP,as directed by  [name], FNP while in the presence of [name], Secretary.  Subjective:     Patient ID: Samuel [name] , male    DOB: 02-Apr-1935 , 86 y.o.   MRN: [id number]   Chief Complaint  Patient presents with   Hyperlipidemia    HPI  Patient presents today for a cholesterol and thyroid check.   Hyperlipidemia This is a chronic problem. The current episode started more than 1 year ago. The problem is controlled. Recent lipid tests were reviewed and are normal. He has no history of chronic renal disease. There are no known factors aggravating his hyperlipidemia. Pertinent negatives include no chest pain. There are no compliance problems.  There are no known risk factors for coronary artery disease.  Thyroid Problem Presents for follow-up visit. Patient reports no fatigue or palpitations. The symptoms have been stable. His past medical history is significant for hyperlipidemia.     Past Medical History:  Diagnosis Date   Hyperlipidemia    Hypothyroid      History reviewed. No pertinent family history.   Current Outpatient Medications:    atorvastatin (LIPITOR) 20 MG tablet, TAKE 1 TABLET BY MOUTH EVERY DAY, Disp: 90 tablet, Rfl: 1   cholecalciferol (VITAMIN D3) 25 MCG (1000 UT) tablet, Take 1,000 Units by mouth daily. Take 3000 units daily, Disp: , Rfl:    dutasteride (AVODART) 0.5 MG capsule, TAKE 1 CAPSULE BY MOUTH EVERY DAY, Disp: 90 capsule, Rfl: 3   levothyroxine (SYNTHROID) 100 MCG tablet, TAKE 1 TABLET BY MOUTH EVERY DAY, Disp: 90 tablet, Rfl: 1   tadalafil (CIALIS) 5 MG tablet, TAKE 1 TABLET BY MOUTH EVERY DAY, Disp: 30 tablet, Rfl: 11   No Known Allergies   Review of Systems  Constitutional: Negative.  Negative for fatigue.  Respiratory: Negative.    Cardiovascular: Negative.  Negative for chest pain, palpitations and leg  swelling.  Gastrointestinal: Negative.   Neurological: Negative.      Today's Vitals   01/10/22 1143  BP: 118/64  Pulse: 64  Temp: 98.2 F (36.8 C)  TempSrc: Oral  Weight: 164 lb (74.4 kg)  Height: 5' 4.4" (1.636 m)   Body mass index is 27.8 kg/m.   Objective:  Physical Exam Vitals reviewed.  Constitutional:      General: He is not in acute distress.    Appearance: Normal appearance.  Cardiovascular:     Rate and Rhythm: Normal rate and regular rhythm.     Pulses: Normal pulses.     Heart sounds: Normal heart sounds. No murmur heard. Pulmonary:     Effort: Pulmonary effort is normal. No respiratory distress.     Breath sounds: Normal breath sounds. No wheezing.  Skin:    General: Skin is warm and dry.     Capillary Refill: Capillary refill takes less than 2 seconds.  Neurological:     General: No focal deficit present.     Mental Status: He is alert and oriented to person, place, and time. Mental status is at baseline.     Cranial Nerves: No cranial nerve deficit.     Motor: No weakness.  Psychiatric:        Mood and Affect: Mood normal.        Behavior: Behavior normal.        Thought Content: Thought content normal.        Judgment:  Judgment normal.         Assessment And Plan:     1. Acquired hypothyroidism Comments: Thyroid levels have been stable, continue current medications.  Will make changes pending labs - BMP8+EGFR - Thyroid Panel With TSH  2. Hyperlipidemia, unspecified hyperlipidemia type Comments: Continue statin, tolerating well - Lipid panel - BMP8+EGFR  3. Function kidney decreased Comments: Will recheck kidney functions.  Encouraged to stay well-hydrated with water     Patient was given opportunity to ask questions. Patient verbalized understanding of the plan and was able to repeat key elements of the plan. All questions were answered to their satisfaction.  [name], FNP   I, [name], FNP, have reviewed all documentation  for this visit. The documentation on 01/10/22 for the exam, diagnosis, procedures, and orders are all accurate and complete.   IF YOU HAVE BEEN REFERRED TO A SPECIALIST, IT MAY TAKE 1-2 WEEKS TO SCHEDULE/PROCESS THE REFERRAL. IF YOU HAVE NOT HEARD FROM US/SPECIALIST IN TWO WEEKS, PLEASE GIVE [place] A CALL AT [phone].   THE PATIENT IS ENCOURAGED TO PRACTICE SOCIAL DISTANCING DUE TO THE COVID-19 PANDEMIC.

## 2022-01-11 ENCOUNTER — Ambulatory Visit (INDEPENDENT_AMBULATORY_CARE_PROVIDER_SITE_OTHER): Payer: Federal, State, Local not specified - PPO

## 2022-01-11 VITALS — Ht 66.0 in | Wt 164.0 lb

## 2022-01-11 DIAGNOSIS — Z0000 Encounter for general adult medical examination without abnormal findings: Secondary | ICD-10-CM

## 2022-01-11 LAB — THYROID PANEL WITH TSH
Free Thyroxine Index: 2.8 (ref 1.2–4.9)
T3 Uptake Ratio: 28 % (ref 24–39)
T4, Total: 10 ug/dL (ref 4.5–12.0)
TSH: 0.941 u[IU]/mL (ref 0.450–4.500)

## 2022-01-11 LAB — BMP8+EGFR
BUN/Creatinine Ratio: 9 — ABNORMAL LOW (ref 10–24)
BUN: 10 mg/dL (ref 8–27)
CO2: 27 mmol/L (ref 20–29)
Calcium: 10 mg/dL (ref 8.6–10.2)
Chloride: 100 mmol/L (ref 96–106)
Creatinine, Ser: 1.13 mg/dL (ref 0.76–1.27)
Glucose: 96 mg/dL (ref 70–99)
Potassium: 4.4 mmol/L (ref 3.5–5.2)
Sodium: 138 mmol/L (ref 134–144)
eGFR: 64 mL/min/{1.73_m2} (ref >59)

## 2022-01-11 LAB — LIPID PANEL
Chol/HDL Ratio: 3.9 ratio (ref 0.0–5.0)
Cholesterol, Total: 175 mg/dL (ref 100–199)
HDL: 45 mg/dL (ref >39)
LDL Chol Calc (NIH): 109 mg/dL — ABNORMAL HIGH (ref 0–99)
Triglycerides: 114 mg/dL (ref 0–149)
VLDL Cholesterol Cal: 21 mg/dL (ref 5–40)

## 2022-01-11 NOTE — Patient Instructions (Signed)
Samuel [name] , Thank you for taking time to come for your Medicare Wellness Visit. I appreciate your ongoing commitment to your health goals. Please review the following plan we discussed and let me know if I can assist you in the future.   These are the goals we discussed:  Goals      Patient Stated     01/11/2022, maintain        This is a list of the screening recommended for you and due dates:  Health Maintenance  Topic Date Due   Zoster (Shingles) Vaccine (1 of 2) 04/12/2022*   COVID-19 Vaccine (6 - 2023-24 season) 01/29/2022   DTaP/Tdap/Td vaccine (3 - Td or Tdap) 07/21/2031   Pneumonia Vaccine  Completed   Flu Shot  Completed   HPV Vaccine  Aged Out  *Topic was postponed. The date shown is not the original due date.    Advanced directives: Advance directive discussed with you today.   Conditions/risks identified: none  Next appointment: Follow up in one year for your annual wellness visit.   Preventive Care 29 Years and Older, Male  Preventive care refers to lifestyle choices and visits with your health care provider that can promote health and wellness. What does preventive care include? A yearly physical exam. This is also called an annual well check. Dental exams once or twice a year. Routine eye exams. Ask your health care provider how often you should have your eyes checked. Personal lifestyle choices, including: Daily care of your teeth and gums. Regular physical activity. Eating a healthy diet. Avoiding tobacco and drug use. Limiting alcohol use. Practicing safe sex. Taking low doses of aspirin every day. Taking vitamin and mineral supplements as recommended by your health care provider. What happens during an annual well check? The services and screenings done by your health care provider during your annual well check will depend on your age, overall health, lifestyle risk factors, and family history of disease. Counseling  Your health care provider may  ask you questions about your: Alcohol use. Tobacco use. Drug use. Emotional well-being. Home and relationship well-being. Sexual activity. Eating habits. History of falls. Memory and ability to understand (cognition). Work and work Astronomer. Screening  You may have the following tests or measurements: Height, weight, and BMI. Blood pressure. Lipid and cholesterol levels. These may be checked every 5 years, or more frequently if you are over 53 years old. Skin check. Lung cancer screening. You may have this screening every year starting at age 54 if you have a 30-pack-year history of smoking and currently smoke or have quit within the past 15 years. Fecal occult blood test (FOBT) of the stool. You may have this test every year starting at age 18. Flexible sigmoidoscopy or colonoscopy. You may have a sigmoidoscopy every 5 years or a colonoscopy every 10 years starting at age 73. Prostate cancer screening. Recommendations will vary depending on your family history and other risks. Hepatitis C blood test. Hepatitis B blood test. Sexually transmitted disease (STD) testing. Diabetes screening. This is done by checking your blood sugar (glucose) after you have not eaten for a while (fasting). You may have this done every 1-3 years. Abdominal aortic aneurysm (AAA) screening. You may need this if you are a current or former smoker. Osteoporosis. You may be screened starting at age 92 if you are at high risk. Talk with your health care provider about your test results, treatment options, and if necessary, the need for more tests. Vaccines  Your health care provider may recommend certain vaccines, such as: Influenza vaccine. This is recommended every year. Tetanus, diphtheria, and acellular pertussis (Tdap, Td) vaccine. You may need a Td booster every 10 years. Zoster vaccine. You may need this after age 33. Pneumococcal 13-valent conjugate (PCV13) vaccine. One dose is recommended after age  70. Pneumococcal polysaccharide (PPSV23) vaccine. One dose is recommended after age 43. Talk to your health care provider about which screenings and vaccines you need and how often you need them. This information is not intended to replace advice given to you by your health care provider. Make sure you discuss any questions you have with your health care provider. Document Released: 02/05/2015 Document Revised: 09/29/2015 Document Reviewed: 11/10/2014 Elsevier Interactive Patient Education  2017 [employer] Prevention in the Home Falls can cause injuries. They can happen to people of all ages. There are many things you can do to make your home safe and to help prevent falls. What can I do on the outside of my home? Regularly fix the edges of walkways and driveways and fix any cracks. Remove anything that might make you trip as you walk through a door, such as a raised step or threshold. Trim any bushes or trees on the path to your home. Use bright outdoor lighting. Clear any walking paths of anything that might make someone trip, such as rocks or tools. Regularly check to see if handrails are loose or broken. Make sure that both sides of any steps have handrails. Any raised decks and porches should have guardrails on the edges. Have any leaves, snow, or ice cleared regularly. Use sand or salt on walking paths during winter. Clean up any spills in your garage right away. This includes oil or grease spills. What can I do in the bathroom? Use night lights. Install grab bars by the toilet and in the tub and shower. Do not use towel bars as grab bars. Use non-skid mats or decals in the tub or shower. If you need to sit down in the shower, use a plastic, non-slip stool. Keep the floor dry. Clean up any water that spills on the floor as soon as it happens. Remove soap buildup in the tub or shower regularly. Attach bath mats securely with double-sided non-slip rug tape. Do not have throw  rugs and other things on the floor that can make you trip. What can I do in the bedroom? Use night lights. Make sure that you have a light by your bed that is easy to reach. Do not use any sheets or blankets that are too big for your bed. They should not hang down onto the floor. Have a firm chair that has side arms. You can use this for support while you get dressed. Do not have throw rugs and other things on the floor that can make you trip. What can I do in the kitchen? Clean up any spills right away. Avoid walking on wet floors. Keep items that you use a lot in easy-to-reach places. If you need to reach something above you, use a strong step stool that has a grab bar. Keep electrical cords out of the way. Do not use floor polish or wax that makes floors slippery. If you must use wax, use non-skid floor wax. Do not have throw rugs and other things on the floor that can make you trip. What can I do with my stairs? Do not leave any items on the stairs. Make sure that there are  handrails on both sides of the stairs and use them. Fix handrails that are broken or loose. Make sure that handrails are as long as the stairways. Check any carpeting to make sure that it is firmly attached to the stairs. Fix any carpet that is loose or worn. Avoid having throw rugs at the top or bottom of the stairs. If you do have throw rugs, attach them to the floor with carpet tape. Make sure that you have a light switch at the top of the stairs and the bottom of the stairs. If you do not have them, ask someone to add them for you. What else can I do to help prevent falls? Wear shoes that: Do not have high heels. Have rubber bottoms. Are comfortable and fit you well. Are closed at the toe. Do not wear sandals. If you use a stepladder: Make sure that it is fully opened. Do not climb a closed stepladder. Make sure that both sides of the stepladder are locked into place. Ask someone to hold it for you, if  possible. Clearly mark and make sure that you can see: Any grab bars or handrails. First and last steps. Where the edge of each step is. Use tools that help you move around (mobility aids) if they are needed. These include: Canes. Walkers. Scooters. Crutches. Turn on the lights when you go into a dark area. Replace any light bulbs as soon as they burn out. Set up your furniture so you have a clear path. Avoid moving your furniture around. If any of your floors are uneven, fix them. If there are any pets around you, be aware of where they are. Review your medicines with your doctor. Some medicines can make you feel dizzy. This can increase your chance of falling. Ask your doctor what other things that you can do to help prevent falls. This information is not intended to replace advice given to you by your health care provider. Make sure you discuss any questions you have with your health care provider. Document Released: 11/05/2008 Document Revised: 06/17/2015 Document Reviewed: 02/13/2014 Elsevier Interactive Patient Education  2017 [employer].

## 2022-01-11 NOTE — Progress Notes (Signed)
I connected with Samuel [name] today by telephone and verified that I am speaking with the correct person using two identifiers. Location patient: home Location provider: work Persons participating in the virtual visit: Samuel [name], Elisha Ponder LPN.   I discussed the limitations, risks, security and privacy concerns of performing an evaluation and management service by telephone and the availability of in person appointments. I also discussed with the patient that there may be a patient responsible charge related to this service. The patient expressed understanding and verbally consented to this telephonic visit.    Interactive audio and video telecommunications were attempted between this provider and patient, however failed, due to patient having technical difficulties OR patient did not have access to video capability.  We continued and completed visit with audio only.     Vital signs may be patient reported or missing.  Subjective:   Samuel [name] is a 86 y.o. male who presents for Medicare Annual/Subsequent preventive examination.  Review of Systems     Cardiac Risk Factors include: advanced age (>44men, >45 women);dyslipidemia;male gender     Objective:    Today's Vitals   01/11/22 1041  Weight: 164 lb (74.4 kg)  Height: 5\' 6"  (1.676 m)   Body mass index is 26.47 kg/m.     01/11/2022   10:43 AM  Advanced Directives  Does Patient Have a Medical Advance Directive? No    Current Medications (verified) Outpatient Encounter Medications as of 01/11/2022  Medication Sig   atorvastatin (LIPITOR) 20 MG tablet TAKE 1 TABLET BY MOUTH EVERY DAY   cholecalciferol (VITAMIN D3) 25 MCG (1000 UT) tablet Take 1,000 Units by mouth daily. Take 3000 units daily   dutasteride (AVODART) 0.5 MG capsule TAKE 1 CAPSULE BY MOUTH EVERY DAY   levothyroxine (SYNTHROID) 100 MCG tablet TAKE 1 TABLET BY MOUTH EVERY DAY   tadalafil (CIALIS) 5 MG tablet TAKE 1 TABLET BY MOUTH EVERY DAY   No  facility-administered encounter medications on file as of 01/11/2022.    Allergies (verified) Patient has no known allergies.   History: Past Medical History:  Diagnosis Date   Hyperlipidemia    Hypothyroid    History reviewed. No pertinent surgical history. History reviewed. No pertinent family history. Social History   Socioeconomic History   Marital status: Married    Spouse name: Not on file   Number of children: Not on file   Years of education: Not on file   Highest education level: Not on file  Occupational History   Not on file  Tobacco Use   Smoking status: Former   Smokeless tobacco: Never  Vaping Use   Vaping Use: Never used  Substance and Sexual Activity   Alcohol use: Yes    Comment: ocassionally   Drug use: Never   Sexual activity: Not on file  Other Topics Concern   Not on file  Social History Narrative   Not on file   Social Determinants of Health   Financial Resource Strain: [name] Risk  (01/11/2022)   Overall Financial Resource Strain (CARDIA)    Difficulty of Paying Living Expenses: Not hard at all  Food Insecurity: No Food Insecurity (01/11/2022)   Hunger Vital Sign    Worried About Running Out of Food in the Last Year: Never true    Ran Out of Food in the Last Year: Never true  Transportation Needs: No Transportation Needs (01/11/2022)   PRAPARE - 01/13/2022 (Medical): No    Lack of Transportation (  Non-Medical): No  Physical Activity: Inactive (01/11/2022)   Exercise Vital Sign    Days of Exercise per Week: 0 days    Minutes of Exercise per Session: 0 min  Stress: No Stress Concern Present (01/11/2022)   [employer] - Occupational Stress Questionnaire    Feeling of Stress : Not at all  Social Connections: Not on file    Tobacco Counseling Counseling given: Not Answered   Clinical Intake:  Pre-visit preparation completed: Yes  Pain : No/denies pain     Nutritional  Status: BMI 25 -29 Overweight Nutritional Risks: None Diabetes: No  How often do you need to have someone help you when you read instructions, pamphlets, or other written materials from your doctor or pharmacy?: 1 - Never  Diabetic? no  Interpreter Needed?: No  Information entered by :: NAllen LPN   Activities of Daily Living    01/11/2022   10:44 AM 01/19/2021    8:43 AM  In your present state of health, do you have any difficulty performing the following activities:  Hearing? 0 0  Vision? 0 0  Difficulty concentrating or making decisions? 0 0  Walking or climbing stairs? 0 0  Dressing or bathing? 0 0  Doing errands, shopping? 0 0  Preparing Food and eating ? N   Using the Toilet? N   In the past six months, have you accidently leaked urine? Y   Do you have problems with loss of bowel control? N   Managing your Medications? N   Managing your Finances? N   Housekeeping or managing your Housekeeping? N     Patient Care Team: Arnette FeltsMoore, Janece, FNP as PCP - General (General Practice)  Indicate any recent Medical Services you may have received from other than [facility] providers in the past year (date may be approximate).     Assessment:   This is a routine wellness examination for Samuel [name].  Hearing/Vision screen Vision Screening - Comments:: Regular eye exams, [facility]  Dietary issues and exercise activities discussed: Current Exercise Habits: The patient does not participate in regular exercise at present   Goals Addressed             This Visit's Progress    Patient Stated       01/11/2022, maintain       Depression Screen    01/11/2022   10:44 AM 01/10/2022   11:42 AM 01/19/2021    8:43 AM 09/04/2019   11:42 AM 07/03/2018    8:52 AM 12/31/2017    9:24 AM  PHQ 2/9 Scores  PHQ - 2 Score 0 0 0 0 0 0    Fall Risk    01/11/2022   10:44 AM 01/10/2022   11:42 AM 01/19/2021    8:43 AM 01/14/2020    8:43 AM 01/09/2019    8:57 AM  Fall Risk   Falls in  the past year? 0 0 0 0 0  Number falls in past yr: 0 0 0    Injury with Fall? 0 0 0    Risk for fall due to : Medication side effect No Fall Risks     Follow up Falls prevention discussed;Education provided;Falls evaluation completed Falls evaluation completed       FALL RISK PREVENTION PERTAINING TO THE HOME:  Any stairs in or around the home? Yes  If so, are there any without handrails? No  Home free of loose throw rugs in walkways, pet beds, electrical cords, etc? Yes  Adequate lighting in your home to reduce risk of falls? Yes   ASSISTIVE DEVICES UTILIZED TO PREVENT FALLS:  Life alert? No  Use of a cane, walker or w/c? No  Grab bars in the bathroom? No  Shower chair or bench in shower? No  Elevated toilet seat or a handicapped toilet? No   TIMED UP AND GO:  Was the test performed? No .       Cognitive Function:        01/11/2022   10:45 AM  6CIT Screen  What Year? 0 points  What month? 0 points  What time? 0 points  Count back from 20 0 points  Months in reverse 0 points  Repeat phrase 2 points  Total Score 2 points    Immunizations Immunization History  Administered Date(s) Administered   COVID-19, mRNA, vaccine(Comirnaty)12 years and older 12/04/2021   Fluad Quad(high Dose 65+) 12/03/2018, 12/01/2019, 12/26/2021   Influenza, High Dose Seasonal PF 11/24/2014, 12/03/2017, 11/22/2020   Influenza-Unspecified 11/07/2017, 12/03/2018, 12/26/2021   Moderna Sars-Covid-2 Vaccination 02/16/2019, 03/16/2019, 12/01/2019, 12/22/2020   Pfizer Covid-19 Vaccine Bivalent Booster 54yrs & up 12/22/2020   Pneumococcal Conjugate-13 12/03/2018   Pneumococcal Polysaccharide-23 01/14/2020   Respiratory Syncytial Virus Vaccine,Recomb Aduvanted(Arexvy) 12/04/2021   Tdap 10/29/2009, 07/20/2021    TDAP status: Up to date  Flu Vaccine status: Up to date  Pneumococcal vaccine status: Up to date  Covid-19 vaccine status: Completed vaccines  Qualifies for Shingles Vaccine?  Yes   Zostavax completed No   Shingrix Completed?: No.    Education has been provided regarding the importance of this vaccine. Patient has been advised to call insurance company to determine out of pocket expense if they have not yet received this vaccine. Advised may also receive vaccine at local pharmacy or Health Dept. Verbalized acceptance and understanding.  Screening Tests Health Maintenance  Topic Date Due   Zoster Vaccines- Shingrix (1 of 2) 04/12/2022 (Originally 01/16/1955)   COVID-19 Vaccine (6 - 2023-24 season) 01/29/2022   DTaP/Tdap/Td (3 - Td or Tdap) 07/21/2031   Pneumonia Vaccine 75+ Years old  Completed   INFLUENZA VACCINE  Completed   HPV VACCINES  Aged Out    Health Maintenance  There are no preventive care reminders to display for this patient.   Colorectal cancer screening: No longer required.   Lung Cancer Screening: ([name] Dose CT Chest recommended if Age 57-80 years, 30 pack-year currently smoking OR have quit w/in 15years.) does not qualify.   Lung Cancer Screening Referral: no  Additional Screening:  Hepatitis C Screening: does not qualify;  Vision Screening: Recommended annual ophthalmology exams for early detection of glaucoma and other disorders of the eye. Is the patient up to date with their annual eye exam?  Yes  Who is the provider or what is the name of the office in which the patient attends annual eye exams? [facility] If pt is not established with a provider, would they like to be referred to a provider to establish care? No .   Dental Screening: Recommended annual dental exams for proper oral hygiene  Community Resource Referral / Chronic Care Management: CRR required this visit?  No   CCM required this visit?  No      Plan:     I have personally reviewed and noted the following in the patient's chart:   Medical and social history Use of alcohol, tobacco or illicit drugs  Current medications and supplements including opioid  prescriptions. Patient is not currently taking  opioid prescriptions. Functional ability and status Nutritional status Physical activity Advanced directives List of other physicians Hospitalizations, surgeries, and ER visits in previous 12 months Vitals Screenings to include cognitive, depression, and falls Referrals and appointments  In addition, I have reviewed and discussed with patient certain preventive protocols, quality metrics, and best practice recommendations. A written personalized care plan for preventive services as well as general preventive health recommendations were provided to patient.     Barb Merino, LPN   [id number]   Nurse Notes: none  Due to this being a virtual visit, the after visit summary with patients personalized plan was offered to patient via mail or my-chart. to pick up at office at next visit

## 2022-05-18 ENCOUNTER — Other Ambulatory Visit: Payer: Self-pay | Admitting: Nurse Practitioner

## 2022-05-18 DIAGNOSIS — E785 Hyperlipidemia, unspecified: Secondary | ICD-10-CM

## 2022-06-19 ENCOUNTER — Other Ambulatory Visit: Payer: Self-pay | Admitting: Nurse Practitioner

## 2022-07-13 ENCOUNTER — Ambulatory Visit: Payer: Self-pay | Admitting: Nurse Practitioner

## 2022-07-18 ENCOUNTER — Encounter: Payer: Self-pay | Admitting: Nurse Practitioner

## 2022-07-18 ENCOUNTER — Ambulatory Visit: Payer: Federal, State, Local not specified - PPO | Admitting: Nurse Practitioner

## 2022-07-18 VITALS — BP 120/74 | HR 53 | Temp 97.9°F | Ht 66.0 in | Wt 162.6 lb

## 2022-07-18 DIAGNOSIS — Z6826 Body mass index (BMI) 26.0-26.9, adult: Secondary | ICD-10-CM | POA: Diagnosis not present

## 2022-07-18 DIAGNOSIS — Z79899 Other long term (current) drug therapy: Secondary | ICD-10-CM | POA: Diagnosis not present

## 2022-07-18 DIAGNOSIS — E7800 Pure hypercholesterolemia, unspecified: Secondary | ICD-10-CM | POA: Diagnosis not present

## 2022-07-18 DIAGNOSIS — E785 Hyperlipidemia, unspecified: Secondary | ICD-10-CM

## 2022-07-18 DIAGNOSIS — E039 Hypothyroidism, unspecified: Secondary | ICD-10-CM

## 2022-07-18 NOTE — Progress Notes (Signed)
Madelaine Bhat, CMA,acting as a Neurosurgeon for Arnette Felts, FNP.,have documented all relevant documentation on the behalf of Arnette Felts, FNP,as directed by  Arnette Felts, FNP while in the presence of Arnette Felts, FNP.  Subjective:  Patient ID: Samuel [name] , male    DOB: 12-20-1935 , 87 y.o.   MRN: [id number]  Chief Complaint  Patient presents with   Hyperlipidemia    HPI  Patient presents today for TSH and chol check. Patient reports compliance with medications and has no other concerns today. Patient denies any chest pains, SOB, or headaches.   BP Readings from Last 3 Encounters: 07/18/22 : 120/74 01/10/22 : 118/64 11/01/21 : 128/80       Past Medical History:  Diagnosis Date   Hyperlipidemia    Hypothyroid      No family history on file.   Current Outpatient Medications:    atorvastatin (LIPITOR) 20 MG tablet, TAKE 1 TABLET BY MOUTH EVERY DAY, Disp: 90 tablet, Rfl: 1   cholecalciferol (VITAMIN D3) 25 MCG (1000 UT) tablet, Take 1,000 Units by mouth daily. Take 3000 units daily, Disp: , Rfl:    dutasteride (AVODART) 0.5 MG capsule, TAKE 1 CAPSULE BY MOUTH EVERY DAY, Disp: 90 capsule, Rfl: 3   levothyroxine (SYNTHROID) 100 MCG tablet, TAKE 1 TABLET BY MOUTH EVERY DAY, Disp: 90 tablet, Rfl: 1   tadalafil (CIALIS) 5 MG tablet, TAKE 1 TABLET BY MOUTH EVERY DAY, Disp: 30 tablet, Rfl: 11   No Known Allergies   Review of Systems  Constitutional: Negative.  Negative for fatigue.  Respiratory: Negative.    Cardiovascular: Negative.  Negative for chest pain, palpitations and leg swelling.  Gastrointestinal: Negative.   Neurological: Negative.      Today's Vitals   07/18/22 1203  BP: 120/74  Pulse: (!) 53  Temp: 97.9 F (36.6 C)  TempSrc: Oral  Weight: 162 lb 9.6 oz (73.8 kg)  Height: 5\' 6"  (1.676 m)  PainSc: 2   PainLoc: Back   Body mass index is 26.24 kg/m.  Wt Readings from Last 3 Encounters:  07/18/22 162 lb 9.6 oz (73.8 kg)  01/11/22 164 lb (74.4 kg)   01/10/22 164 lb (74.4 kg)    The ASCVD Risk score (Arnett DK, et al., 2019) failed to calculate for the following reasons:   The 2019 ASCVD risk score is only valid for ages 18 to 59  Objective:  Physical Exam Vitals reviewed.  Constitutional:      General: He is not in acute distress.    Appearance: Normal appearance.  Cardiovascular:     Rate and Rhythm: Normal rate and regular rhythm.     Pulses: Normal pulses.     Heart sounds: Normal heart sounds. No murmur heard. Pulmonary:     Effort: Pulmonary effort is normal. No respiratory distress.     Breath sounds: Normal breath sounds. No wheezing.  Skin:    General: Skin is warm and dry.     Capillary Refill: Capillary refill takes less than 2 seconds.  Neurological:     General: No focal deficit present.     Mental Status: He is alert and oriented to person, place, and time. Mental status is at baseline.     Cranial Nerves: No cranial nerve deficit.     Motor: No weakness.  Psychiatric:        Mood and Affect: Mood normal.        Behavior: Behavior normal.        Thought Content:  Thought content normal.        Judgment: Judgment normal.         Assessment And Plan:  1. Acquired hypothyroidism Comments: Controlled, continue current medications - TSH + free T4 - BMP8+eGFR  2. Elevated LDL cholesterol level Comments: Cholesterol was improved at last visit, continue statin. Will check lipid panel - Lipid panel - BMP8+eGFR  3. Body mass index (BMI) 26.0-26.9, adult  4. Other long term (current) drug therapy - BMP8+eGFR    Return for 6 month chol check.  Patient was given opportunity to ask questions. Patient verbalized understanding of the plan and was able to repeat key elements of the plan. All questions were answered to their satisfaction.  Arnette Felts, FNP  I, Arnette Felts, FNP, have reviewed all documentation for this visit. The documentation on 07/18/22 for the exam, diagnosis, procedures, and orders are  all accurate and complete.   IF YOU HAVE BEEN REFERRED TO A SPECIALIST, IT MAY TAKE 1-2 WEEKS TO SCHEDULE/PROCESS THE REFERRAL. IF YOU HAVE NOT HEARD FROM US/SPECIALIST IN TWO WEEKS, PLEASE GIVE [place] A CALL AT [phone].

## 2022-07-19 LAB — BMP8+EGFR
BUN/Creatinine Ratio: 10 (ref 10–24)
BUN: 10 mg/dL (ref 8–27)
CO2: 24 mmol/L (ref 20–29)
Calcium: 9.5 mg/dL (ref 8.6–10.2)
Chloride: 100 mmol/L (ref 96–106)
Creatinine, Ser: 1 mg/dL (ref 0.76–1.27)
Glucose: 96 mg/dL (ref 70–99)
Potassium: 4.2 mmol/L (ref 3.5–5.2)
Sodium: 137 mmol/L (ref 134–144)
eGFR: 74 mL/min/{1.73_m2} (ref >59)

## 2022-07-19 LAB — TSH+FREE T4
Free T4: 1.57 ng/dL (ref 0.82–1.77)
TSH: 0.758 u[IU]/mL (ref 0.450–4.500)

## 2022-07-19 LAB — LIPID PANEL
Chol/HDL Ratio: 3.7 ratio (ref 0.0–5.0)
Cholesterol, Total: 156 mg/dL (ref 100–199)
HDL: 42 mg/dL (ref >39)
LDL Chol Calc (NIH): 96 mg/dL (ref 0–99)
Triglycerides: 96 mg/dL (ref 0–149)
VLDL Cholesterol Cal: 18 mg/dL (ref 5–40)

## 2022-08-04 ENCOUNTER — Encounter: Payer: Self-pay | Admitting: Nurse Practitioner

## 2022-08-29 ENCOUNTER — Other Ambulatory Visit: Payer: Self-pay | Admitting: Urology

## 2022-09-22 ENCOUNTER — Other Ambulatory Visit: Payer: Self-pay | Admitting: Nurse Practitioner

## 2022-11-18 ENCOUNTER — Other Ambulatory Visit: Payer: Self-pay | Admitting: Nurse Practitioner

## 2022-11-18 DIAGNOSIS — E785 Hyperlipidemia, unspecified: Secondary | ICD-10-CM

## 2023-01-15 ENCOUNTER — Ambulatory Visit: Payer: Medicare Other | Admitting: Nurse Practitioner

## 2023-01-31 ENCOUNTER — Encounter: Payer: Self-pay | Admitting: Nurse Practitioner

## 2023-01-31 ENCOUNTER — Ambulatory Visit (INDEPENDENT_AMBULATORY_CARE_PROVIDER_SITE_OTHER): Payer: Federal, State, Local not specified - PPO | Admitting: Nurse Practitioner

## 2023-01-31 VITALS — BP 120/60 | HR 68 | Temp 98.0°F | Ht 66.0 in | Wt 156.6 lb

## 2023-01-31 DIAGNOSIS — Z79899 Other long term (current) drug therapy: Secondary | ICD-10-CM

## 2023-01-31 DIAGNOSIS — E782 Mixed hyperlipidemia: Secondary | ICD-10-CM | POA: Diagnosis not present

## 2023-01-31 DIAGNOSIS — Z6825 Body mass index (BMI) 25.0-25.9, adult: Secondary | ICD-10-CM | POA: Diagnosis not present

## 2023-01-31 DIAGNOSIS — E663 Overweight: Secondary | ICD-10-CM | POA: Diagnosis not present

## 2023-01-31 DIAGNOSIS — Z2821 Immunization not carried out because of patient refusal: Secondary | ICD-10-CM | POA: Insufficient documentation

## 2023-01-31 DIAGNOSIS — E039 Hypothyroidism, unspecified: Secondary | ICD-10-CM

## 2023-01-31 MED ORDER — LEVOTHYROXINE SODIUM 100 MCG PO TABS: 100.0000 ug | ORAL_TABLET | Freq: Every day | ORAL | 1 refills | Status: DC

## 2023-01-31 MED ORDER — ATORVASTATIN CALCIUM 20 MG PO TABS: 20.0000 mg | ORAL_TABLET | Freq: Every day | ORAL | 1 refills | Status: DC

## 2023-01-31 NOTE — Assessment & Plan Note (Signed)
 Thyroid levels are normal. Continue medications

## 2023-01-31 NOTE — Assessment & Plan Note (Signed)
 Cholesterol levels are normal, continue statin, tolerating well.

## 2023-01-31 NOTE — Assessment & Plan Note (Signed)
 Continue exercising regularly, he has lost 6 lbs since his last visit.

## 2023-01-31 NOTE — Progress Notes (Signed)
 Samuel [name], CMA,acting as a neurosurgeon for Samuel Ada, FNP.,have documented all relevant documentation on the behalf of Samuel Ada, FNP,as directed by  Samuel Ada, FNP while in the presence of Samuel Ada, FNP.  Subjective:  Patient ID: Samuel [name] , male    DOB: 1935/02/03 , 88 y.o.   MRN: [id number]  Chief Complaint  Patient presents with   Hypothyroidism   Hyperlipidemia    HPI  Patient presents today for a thyroid  and chol follow up, Patient reports compliance with medication. Patient denies any chest pain, SOB, or headaches. Patient has no concerns today.      Past Medical History:  Diagnosis Date   Hyperlipidemia    Hypothyroid      History reviewed. No pertinent family history.   Current Outpatient Medications:    cholecalciferol (VITAMIN D3) 25 MCG (1000 UT) tablet, Take 1,000 Units by mouth daily. Take 3000 units daily, Disp: , Rfl:    dutasteride (AVODART) 0.5 MG capsule, TAKE 1 CAPSULE BY MOUTH EVERY DAY, Disp: 90 capsule, Rfl: 3   tadalafil (CIALIS) 5 MG tablet, TAKE 1 TABLET BY MOUTH EVERY DAY, Disp: 30 tablet, Rfl: 11   atorvastatin  (LIPITOR) 20 MG tablet, Take 1 tablet (20 mg total) by mouth daily., Disp: 90 tablet, Rfl: 1   levothyroxine  (SYNTHROID ) 100 MCG tablet, Take 1 tablet (100 mcg total) by mouth daily., Disp: 90 tablet, Rfl: 1   No Known Allergies   Review of Systems  Constitutional: Negative.   HENT: Negative.    Eyes: Negative.   Respiratory: Negative.    Cardiovascular: Negative.   Gastrointestinal: Negative.   Neurological: Negative.   Psychiatric/Behavioral: Negative.       Today's Vitals   01/31/23 0909  BP: 120/60  Pulse: 68  Temp: 98 F (36.7 C)  TempSrc: Oral  Weight: 156 lb 9.6 oz (71 kg)  Height: 5' 6 (1.676 m)  PainSc: 0-No pain   Body mass index is 25.28 kg/m.  Wt Readings from Last 3 Encounters:  01/31/23 156 lb 9.6 oz (71 kg)  07/18/22 162 lb 9.6 oz (73.8 kg)  01/11/22 164 lb (74.4 kg)     Objective:   Physical Exam Vitals reviewed.  Constitutional:      General: He is not in acute distress.    Appearance: Normal appearance.  Cardiovascular:     Rate and Rhythm: Normal rate and regular rhythm.     Pulses: Normal pulses.     Heart sounds: Normal heart sounds. No murmur heard. Pulmonary:     Effort: Pulmonary effort is normal. No respiratory distress.     Breath sounds: Normal breath sounds. No wheezing.  Skin:    General: Skin is warm and dry.     Capillary Refill: Capillary refill takes less than 2 seconds.  Neurological:     General: No focal deficit present.     Mental Status: He is alert and oriented to person, place, and time. Mental status is at baseline.     Cranial Nerves: No cranial nerve deficit.     Motor: No weakness.  Psychiatric:        Mood and Affect: Mood normal.        Behavior: Behavior normal.        Thought Content: Thought content normal.        Judgment: Judgment normal.         Assessment And Plan:  Acquired hypothyroidism Assessment & Plan: Thyroid  levels are normal. Continue medications   Orders: -  TSH + free T4 -     Levothyroxine  Sodium; Take 1 tablet (100 mcg total) by mouth daily.  Dispense: 90 tablet; Refill: 1  Mixed hyperlipidemia Assessment & Plan: Cholesterol levels are normal, continue statin, tolerating well.  Orders: -     Lipid panel -     CMP14+EGFR -     Atorvastatin  Calcium ; Take 1 tablet (20 mg total) by mouth daily.  Dispense: 90 tablet; Refill: 1  Herpes zoster vaccination declined Assessment & Plan: Declines shingrix, educated on disease process and is aware if he changes his mind to notify office    Overweight with body mass index (BMI) of 25 to 25.9 in adult Assessment & Plan: Continue exercising regularly, he has lost 6 lbs since his last visit.   Other long term (current) drug therapy -     CMP14+EGFR -     CBC with Differential/Platelet   Return for 6 month thyroid  check/HM and schedule AWV.    Patient was given opportunity to ask questions. Patient verbalized understanding of the plan and was able to repeat key elements of the plan. All questions were answered to their satisfaction.    Samuel Samuel Ada, FNP, have reviewed all documentation for this visit. The documentation on 01/31/23 for the exam, diagnosis, procedures, and orders are all accurate and complete.   IF YOU HAVE BEEN REFERRED TO A SPECIALIST, IT MAY TAKE 1-2 WEEKS TO SCHEDULE/PROCESS THE REFERRAL. IF YOU HAVE NOT HEARD FROM US /SPECIALIST IN TWO WEEKS, PLEASE GIVE US  A CALL AT [phone] X [phone].

## 2023-01-31 NOTE — Assessment & Plan Note (Signed)
 Declines shingrix, educated on disease process and is aware if he changes his mind to notify office

## 2023-02-01 LAB — CBC WITH DIFFERENTIAL/PLATELET
Basophils Absolute: 0 10*3/uL (ref 0.0–0.2)
Basos: 1 %
EOS (ABSOLUTE): 0.2 10*3/uL (ref 0.0–0.4)
Eos: 5 %
Hematocrit: 37.2 % — ABNORMAL LOW (ref 37.5–51.0)
Hemoglobin: 12.4 g/dL — ABNORMAL LOW (ref 13.0–17.7)
Immature Grans (Abs): 0 10*3/uL (ref 0.0–0.1)
Immature Granulocytes: 0 %
Lymphocytes Absolute: 1.8 10*3/uL (ref 0.7–3.1)
Lymphs: 39 %
MCH: 31.6 pg (ref 26.6–33.0)
MCHC: 33.3 g/dL (ref 31.5–35.7)
MCV: 95 fL (ref 79–97)
Monocytes Absolute: 0.5 10*3/uL (ref 0.1–0.9)
Monocytes: 10 %
Neutrophils Absolute: 2.1 10*3/uL (ref 1.4–7.0)
Neutrophils: 45 %
Platelets: 221 10*3/uL (ref 150–450)
RBC: 3.92 x10E6/uL — ABNORMAL LOW (ref 4.14–5.80)
RDW: 10.9 % — ABNORMAL LOW (ref 11.6–15.4)
WBC: 4.6 10*3/uL (ref 3.4–10.8)

## 2023-02-01 LAB — CMP14+EGFR
ALT: 18 [IU]/L (ref 0–44)
AST: 19 [IU]/L (ref 0–40)
Albumin: 4.1 g/dL (ref 3.7–4.7)
Alkaline Phosphatase: 93 [IU]/L (ref 44–121)
BUN/Creatinine Ratio: 8 — ABNORMAL LOW (ref 10–24)
BUN: 9 mg/dL (ref 8–27)
Bilirubin Total: 0.5 mg/dL (ref 0.0–1.2)
CO2: 25 mmol/L (ref 20–29)
Calcium: 9.8 mg/dL (ref 8.6–10.2)
Chloride: 102 mmol/L (ref 96–106)
Creatinine, Ser: 1.08 mg/dL (ref 0.76–1.27)
Globulin, Total: 2.6 g/dL (ref 1.5–4.5)
Glucose: 114 mg/dL — ABNORMAL HIGH (ref 70–99)
Potassium: 4.2 mmol/L (ref 3.5–5.2)
Sodium: 140 mmol/L (ref 134–144)
Total Protein: 6.7 g/dL (ref 6.0–8.5)
eGFR: 67 mL/min/{1.73_m2} (ref >59)

## 2023-02-01 LAB — LIPID PANEL
Chol/HDL Ratio: 3.8 {ratio} (ref 0.0–5.0)
Cholesterol, Total: 171 mg/dL (ref 100–199)
HDL: 45 mg/dL (ref >39)
LDL Chol Calc (NIH): 106 mg/dL — ABNORMAL HIGH (ref 0–99)
Triglycerides: 110 mg/dL (ref 0–149)
VLDL Cholesterol Cal: 20 mg/dL (ref 5–40)

## 2023-02-01 LAB — TSH+FREE T4
Free T4: 1.58 ng/dL (ref 0.82–1.77)
TSH: 0.735 u[IU]/mL (ref 0.450–4.500)

## 2023-04-04 ENCOUNTER — Ambulatory Visit (INDEPENDENT_AMBULATORY_CARE_PROVIDER_SITE_OTHER): Payer: Self-pay

## 2023-04-04 VITALS — BP 102/50 | HR 73 | Temp 97.7°F | Ht 66.0 in | Wt 160.4 lb

## 2023-04-04 DIAGNOSIS — Z0000 Encounter for general adult medical examination without abnormal findings: Secondary | ICD-10-CM | POA: Diagnosis not present

## 2023-04-04 NOTE — Patient Instructions (Signed)
 Mr. Samuel [name] , Thank you for taking time to come for your Medicare Wellness Visit. I appreciate your ongoing commitment to your health goals. Please review the following plan we discussed and let me know if I can assist you in the future.   Referrals/Orders/Follow-Ups/Clinician Recommendations: none  This is a list of the screening recommended for you and due dates:  Health Maintenance  Topic Date Due   Zoster (Shingles) Vaccine (1 of 2) 05/01/2023*   COVID-19 Vaccine (7 - Moderna risk 2024-25 season) 06/26/2023   DTaP/Tdap/Td vaccine (3 - Td or Tdap) 07/21/2031   Pneumonia Vaccine  Completed   Flu Shot  Completed   HPV Vaccine  Aged Out  *Topic was postponed. The date shown is not the original due date.    Advanced directives: (Declined) Advance directive discussed with you today. Even though you declined this today, please call our office should you change your mind, and we can give you the proper paperwork for you to fill out.  Next Medicare Annual Wellness Visit scheduled for next year: Yes  insert Preventive Care attachment Insert FALL PREVENTION attachment if needed

## 2023-04-04 NOTE — Progress Notes (Signed)
 Subjective:   Samuel [name] is a 88 y.o. who presents for a Medicare Wellness preventive visit.  Visit Complete: In person    AWV Questionnaire: No: Patient Medicare AWV questionnaire was not completed prior to this visit.  Cardiac Risk Factors include: advanced age (>22men, >35 women);dyslipidemia;male gender     Objective:    Today's Vitals   04/04/23 0903  BP: (!) 102/50  Pulse: 73  Temp: 97.7 F (36.5 C)  TempSrc: Oral  SpO2: 96%  Weight: 160 lb 6.4 oz (72.8 kg)  Height: 5\' 6"  (1.676 m)   Body mass index is 25.89 kg/m.     04/04/2023    9:09 AM 01/11/2022   10:43 AM  Advanced Directives  Does Patient Have a Medical Advance Directive? No No  Would patient like information on creating a medical advance directive? No - Patient declined     Current Medications (verified) Outpatient Encounter Medications as of 04/04/2023  Medication Sig   atorvastatin (LIPITOR) 20 MG tablet Take 1 tablet (20 mg total) by mouth daily.   cholecalciferol (VITAMIN D3) 25 MCG (1000 UT) tablet Take 1,000 Units by mouth daily. Take 3000 units daily   dutasteride (AVODART) 0.5 MG capsule TAKE 1 CAPSULE BY MOUTH EVERY DAY   levothyroxine (SYNTHROID) 100 MCG tablet Take 1 tablet (100 mcg total) by mouth daily.   tadalafil (CIALIS) 5 MG tablet TAKE 1 TABLET BY MOUTH EVERY DAY   No facility-administered encounter medications on file as of 04/04/2023.    Allergies (verified) Patient has no known allergies.   History: Past Medical History:  Diagnosis Date   Hyperlipidemia    Hypothyroid    History reviewed. No pertinent surgical history. History reviewed. No pertinent family history. Social History   Socioeconomic History   Marital status: Married    Spouse name: Not on file   Number of children: Not on file   Years of education: Not on file   Highest education level: Not on file  Occupational History   Not on file  Tobacco Use   Smoking status: Former   Smokeless tobacco:  Never  Vaping Use   Vaping status: Never Used  Substance and Sexual Activity   Alcohol use: Yes    Comment: ocassionally   Drug use: Never   Sexual activity: Not on file  Other Topics Concern   Not on file  Social History Narrative   Not on file   Social Drivers of Health   Financial Resource Strain: Low Risk  (04/04/2023)   Overall Financial Resource Strain (CARDIA)    Difficulty of Paying Living Expenses: Not hard at all  Food Insecurity: No Food Insecurity (04/04/2023)   Hunger Vital Sign    Worried About Running Out of Food in the Last Year: Never true    Ran Out of Food in the Last Year: Never true  Transportation Needs: No Transportation Needs (04/04/2023)   PRAPARE - Administrator, Civil Service (Medical): No    Lack of Transportation (Non-Medical): No  Physical Activity: Inactive (04/04/2023)   Exercise Vital Sign    Days of Exercise per Week: 0 days    Minutes of Exercise per Session: 0 min  Stress: No Stress Concern Present (04/04/2023)   [employer] of Occupational Health - Occupational Stress Questionnaire    Feeling of Stress : Not at all  Social Connections: Socially Integrated (04/04/2023)   Social Connection and Isolation Panel [NHANES]    Frequency of Communication with Friends and Family:  More than three times a week    Frequency of Social Gatherings with Friends and Family: Twice a week    Attends Religious Services: More than 4 times per year    Active Member of [employer] or Organizations: Yes    Attends Engineer, structural: More than 4 times per year    Marital Status: Married    Tobacco Counseling Counseling given: Not Answered    Clinical Intake:  Pre-visit preparation completed: Yes  Pain : No/denies pain     Nutritional Status: BMI 25 -29 Overweight Nutritional Risks: None Diabetes: No  How often do you need to have someone help you when you read instructions, pamphlets, or other written materials from your doctor  or pharmacy?: 1 - Never  Interpreter Needed?: No  Information entered by :: NAllen LPN   Activities of Daily Living     04/04/2023    9:04 AM  In your present state of health, do you have any difficulty performing the following activities:  Hearing? 0  Vision? 0  Difficulty concentrating or making decisions? 0  Walking or climbing stairs? 0  Dressing or bathing? 0  Doing errands, shopping? 0  Preparing Food and eating ? N  Using the Toilet? N  In the past six months, have you accidently leaked urine? Y  Comment sees an urologist  Do you have problems with loss of bowel control? N  Managing your Medications? N  Managing your Finances? N  Housekeeping or managing your Housekeeping? N    Patient Care Team: Arnette Felts, FNP as PCP - General (General Practice)  Indicate any recent Medical Services you may have received from other than [facility] providers in the past year (date may be approximate).     Assessment:   This is a routine wellness examination for Samuel [name].  Hearing/Vision screen Hearing Screening - Comments:: Denies hearing issues Vision Screening - Comments:: Regular eye exams, [facility]   Goals Addressed             This Visit's Progress    Patient Stated       04/04/2023, maintain       Depression Screen     04/04/2023    9:10 AM 07/18/2022   12:02 PM 01/11/2022   10:44 AM 01/10/2022   11:42 AM 01/19/2021    8:43 AM 09/04/2019   11:42 AM 07/03/2018    8:52 AM  PHQ 2/9 Scores  PHQ - 2 Score 0 0 0 0 0 0 0  PHQ- 9 Score 0 0         Fall Risk     04/04/2023    9:09 AM 07/18/2022   12:02 PM 01/11/2022   10:44 AM 01/10/2022   11:42 AM 01/19/2021    8:43 AM  Fall Risk   Falls in the past year? 0 0 0 0 0  Number falls in past yr: 0 0 0 0 0  Injury with Fall? 0 0 0 0 0  Risk for fall due to : Medication side effect No Fall Risks Medication side effect No Fall Risks   Follow up Falls prevention discussed;Falls evaluation completed Falls  evaluation completed;Falls prevention discussed Falls prevention discussed;Education provided;Falls evaluation completed Falls evaluation completed     MEDICARE RISK AT HOME:  Medicare Risk at Home Any stairs in or around the home?: Yes If so, are there any without handrails?: No Home free of loose throw rugs in walkways, pet beds, electrical cords, etc?: Yes Adequate lighting  in your home to reduce risk of falls?: Yes Life alert?: No Use of a cane, walker or w/c?: No Grab bars in the bathroom?: No Shower chair or bench in shower?: No Elevated toilet seat or a handicapped toilet?: No  TIMED UP AND GO:  Was the test performed?  No  Cognitive Function: 6CIT completed        04/04/2023    9:10 AM 01/11/2022   10:45 AM  6CIT Screen  What Year? 0 points 0 points  What month? 0 points 0 points  What time? 0 points 0 points  Count back from 20 0 points 0 points  Months in reverse 0 points 0 points  Repeat phrase 2 points 2 points  Total Score 2 points 2 points    Immunizations Immunization History  Administered Date(s) Administered   Fluad Quad(high Dose 65+) 12/03/2018, 12/01/2019, 12/26/2021   Influenza, High Dose Seasonal PF 11/24/2014, 12/03/2017, 11/22/2020, 12/26/2022   Influenza-Unspecified 11/07/2017, 12/03/2018, 12/26/2021   Moderna Sars-Covid-2 Vaccination 02/16/2019, 03/16/2019, 12/01/2019, 12/22/2020   Pfizer Covid-19 Vaccine Bivalent Booster 1yrs & up 12/22/2020   Pfizer(Comirnaty)Fall Seasonal Vaccine 12 years and older 12/04/2021, 12/26/2022   Pneumococcal Conjugate-13 12/03/2018   Pneumococcal Polysaccharide-23 01/14/2020   Respiratory Syncytial Virus Vaccine,Recomb Aduvanted(Arexvy) 12/04/2021   Tdap 10/29/2009, 07/20/2021    Screening Tests Health Maintenance  Topic Date Due   Zoster Vaccines- Shingrix (1 of 2) 05/01/2023 (Originally 01/16/1955)   COVID-19 Vaccine (7 - Moderna risk 2024-25 season) 06/26/2023   DTaP/Tdap/Td (3 - Td or Tdap) 07/21/2031    Pneumonia Vaccine 70+ Years old  Completed   INFLUENZA VACCINE  Completed   HPV VACCINES  Aged Out    Health Maintenance  There are no preventive care reminders to display for this patient. Health Maintenance Items Addressed: Up to date  Additional Screening:  Vision Screening: Recommended annual ophthalmology exams for early detection of glaucoma and other disorders of the eye.  Dental Screening: Recommended annual dental exams for proper oral hygiene  Community Resource Referral / Chronic Care Management: CRR required this visit?  No   CCM required this visit?  No     Plan:     I have personally reviewed and noted the following in the patient's chart:   Medical and social history Use of alcohol, tobacco or illicit drugs  Current medications and supplements including opioid prescriptions. Patient is not currently taking opioid prescriptions. Functional ability and status Nutritional status Physical activity Advanced directives List of other physicians Hospitalizations, surgeries, and ER visits in previous 12 months Vitals Screenings to include cognitive, depression, and falls Referrals and appointments  In addition, I have reviewed and discussed with patient certain preventive protocols, quality metrics, and best practice recommendations. A written personalized care plan for preventive services as well as general preventive health recommendations were provided to patient.     Barb Merino, LPN   1/61/0960   After Visit Summary: (In Person-Printed) AVS printed and given to the patient  Notes: Nothing significant to report at this time.

## 2023-07-30 ENCOUNTER — Encounter: Payer: Self-pay | Admitting: Nurse Practitioner

## 2023-07-30 ENCOUNTER — Ambulatory Visit (INDEPENDENT_AMBULATORY_CARE_PROVIDER_SITE_OTHER): Admitting: Nurse Practitioner

## 2023-07-30 VITALS — BP 122/60 | HR 91 | Temp 98.6°F | Ht 66.0 in | Wt 161.4 lb

## 2023-07-30 DIAGNOSIS — Z79899 Other long term (current) drug therapy: Secondary | ICD-10-CM

## 2023-07-30 DIAGNOSIS — E559 Vitamin D deficiency, unspecified: Secondary | ICD-10-CM | POA: Diagnosis not present

## 2023-07-30 DIAGNOSIS — E039 Hypothyroidism, unspecified: Secondary | ICD-10-CM

## 2023-07-30 DIAGNOSIS — Z0000 Encounter for general adult medical examination without abnormal findings: Secondary | ICD-10-CM | POA: Diagnosis not present

## 2023-07-30 DIAGNOSIS — E782 Mixed hyperlipidemia: Secondary | ICD-10-CM | POA: Diagnosis not present

## 2023-07-30 DIAGNOSIS — Z2821 Immunization not carried out because of patient refusal: Secondary | ICD-10-CM

## 2023-07-30 DIAGNOSIS — N400 Enlarged prostate without lower urinary tract symptoms: Secondary | ICD-10-CM

## 2023-07-30 MED ORDER — ATORVASTATIN CALCIUM 20 MG PO TABS: 20.0000 mg | ORAL_TABLET | Freq: Every day | ORAL | 1 refills | Status: DC

## 2023-07-30 MED ORDER — LEVOTHYROXINE SODIUM 100 MCG PO TABS: 100.0000 ug | ORAL_TABLET | Freq: Every day | ORAL | 1 refills | Status: DC

## 2023-07-30 NOTE — Assessment & Plan Note (Signed)
 Continue f/u with urology and taking medications as directed

## 2023-07-30 NOTE — Assessment & Plan Note (Signed)
 Declines shingrix, educated on disease process and is aware if he changes his mind to notify office

## 2023-07-30 NOTE — Assessment & Plan Note (Addendum)
 Thyroid  levels are normal. Continue medications, refill sent. Will recheck thyroid  levels.

## 2023-07-30 NOTE — Progress Notes (Signed)
 LILLETTE Kristeen JINNY Gladis, CMA,acting as a Neurosurgeon for [name], FNP.,have documented all relevant documentation on the behalf of [name], FNP,as directed by  [name], FNP while in the presence of [name], FNP.  Subjective:   Patient ID: Samuel [name] , male    DOB: 24-Mar-1935 , 88 y.o.   MRN: [id number]  Chief Complaint  Patient presents with   Annual Exam    Patient presents today for HM, Patient reports compliance with medication. Patient denies any chest pain, SOB, or headaches. Patient has no concerns today.      Discussed the use of AI scribe software for clinical note transcription with the patient, who gave verbal consent to proceed.  History of Present Illness The patient presents for an annual physical exam and follow-up on thyroid  and cholesterol management.  He is currently taking levothyroxine  and a cholesterol medication, both filled at [facility] on [address]. He does not require refills at this time. He prefers to wait until the fall for the COVID vaccine, as his last dose was in December, and does not wish to receive a shingles vaccine at this time.  His blood pressure was previously 120/102 over 50 in March and is now 122/54. He experiences no dizziness or lightheadedness but reports being 'always dehydrated' and has to force himself to drink water. His fluid intake is minimal, primarily consisting of coffee in the morning and a little juice.  He continues to experience episodes of urinary incontinence, requiring the use of a guard, especially when not close to home. There have been no changes in his condition, and he continues to see a urologist.  No chest pain, shortness of breath, constipation, or diarrhea.    HPI   Past Medical History:  Diagnosis Date   Hyperlipidemia    Hypothyroid      History reviewed. No pertinent family history.   Current Outpatient Medications:    cholecalciferol (VITAMIN D3) 25 MCG (1000 UT) tablet, Take 1,000 Units by mouth daily.  Take 3000 units daily, Disp: , Rfl:    dutasteride (AVODART) 0.5 MG capsule, TAKE 1 CAPSULE BY MOUTH EVERY DAY, Disp: 90 capsule, Rfl: 3   tadalafil (CIALIS) 5 MG tablet, TAKE 1 TABLET BY MOUTH EVERY DAY, Disp: 30 tablet, Rfl: 11   atorvastatin  (LIPITOR) 20 MG tablet, Take 1 tablet (20 mg total) by mouth daily., Disp: 90 tablet, Rfl: 1   levothyroxine  (SYNTHROID ) 100 MCG tablet, Take 1 tablet (100 mcg total) by mouth daily., Disp: 90 tablet, Rfl: 1   No Known Allergies   Men's preventive visit. Patient Health Questionnaire (PHQ-2) is  Flowsheet Row Clinical Support from 04/04/2023 in [facility]  PHQ-2 Total Score 0  Patient is on a Regular diet. He is physically active. Marital status: Married. Relevant history for alcohol use is:  Social History   Substance and Sexual Activity  Alcohol Use Yes   Comment: ocassionally  Relevant history for tobacco use is:  Social History   Tobacco Use  Smoking Status Former  Smokeless Tobacco Never  .   Review of Systems  Constitutional: Negative.   HENT: Negative.         Reports a cracking with turning his neck  Eyes: Negative.   Respiratory: Negative.    Cardiovascular: Negative.  Negative for chest pain, palpitations and leg swelling.  Gastrointestinal: Negative.  Negative for diarrhea.  Endocrine: Negative.   Genitourinary:  Negative for urgency.       Occasional urinary incontinence  Musculoskeletal: Negative.   Skin: Negative.   Allergic/Immunologic: Negative.   Neurological: Negative.   Hematological: Negative.   Psychiatric/Behavioral: Negative.  The patient is not nervous/anxious.      Today's Vitals   07/30/23 1201 07/30/23 1240  BP: (!) 122/54 122/60  Pulse: 91   Temp: 98.6 F (37 C)   TempSrc: Oral   Weight: 161 lb 6.4 oz (73.2 kg)   Height: 5' 6 (1.676 m)   PainSc: 0-No pain    Body mass index is 26.05 kg/m.  Wt Readings from Last 3 Encounters:  07/30/23 161 lb 6.4 oz (73.2  kg)  04/04/23 160 lb 6.4 oz (72.8 kg)  01/31/23 156 lb 9.6 oz (71 kg)    Objective:  Physical Exam Vitals and nursing note reviewed.  Constitutional:      General: He is not in acute distress.    Appearance: Normal appearance. He is obese.  HENT:     Head: Normocephalic and atraumatic.     Right Ear: Tympanic membrane, ear canal and external ear normal. There is no impacted cerumen.     Left Ear: Tympanic membrane, ear canal and external ear normal. There is no impacted cerumen.     Nose: Nose normal.     Mouth/Throat:     Mouth: Mucous membranes are moist.  Cardiovascular:     Rate and Rhythm: Normal rate and regular rhythm.     Pulses: Normal pulses.     Heart sounds: Normal heart sounds. No murmur heard. Pulmonary:     Effort: Pulmonary effort is normal. No respiratory distress.     Breath sounds: Normal breath sounds. No wheezing.  Abdominal:     General: Abdomen is flat. Bowel sounds are normal. There is no distension.     Palpations: Abdomen is soft.  Genitourinary:    Prostate: Normal.     Rectum: Guaiac result negative.  Musculoskeletal:        General: No swelling or tenderness. Normal range of motion.     Cervical back: Normal range of motion and neck supple. No rigidity.     Right lower leg: No edema.     Left lower leg: No edema.  Skin:    General: Skin is warm.     Capillary Refill: Capillary refill takes less than 2 seconds.  Neurological:     General: No focal deficit present.     Mental Status: He is alert and oriented to person, place, and time.  Psychiatric:        Mood and Affect: Mood normal.        Behavior: Behavior normal.        Thought Content: Thought content normal.        Judgment: Judgment normal.         Assessment And Plan:    Encounter for annual health examination Assessment & Plan: Behavior modifications discussed and diet history reviewed.   Pt will continue to exercise regularly and modify diet with low GI, plant based foods  and decrease intake of processed foods.  Recommend intake of daily multivitamin, Vitamin D , and calcium .  Recommend for preventive screenings, as well as recommend immunizations that include influenza, TDAP, and Shingles    Acquired hypothyroidism Assessment & Plan: Thyroid  levels are normal. Continue medications, refill sent. Will recheck thyroid  levels.   Orders: -     TSH + free T4 -     Levothyroxine  Sodium; Take 1 tablet (100 mcg total) by mouth daily.  Dispense: 90  tablet; Refill: 1  Mixed hyperlipidemia Assessment & Plan: Cholesterol levels are normal, continue statin, tolerating well.  Orders: -     CMP14+EGFR -     Lipid panel -     Atorvastatin  Calcium ; Take 1 tablet (20 mg total) by mouth daily.  Dispense: 90 tablet; Refill: 1  Vitamin D  deficiency Assessment & Plan: Will check vitamin D  level and supplement as needed.    Also encouraged to spend 15 minutes in the sun daily.    Orders: -     VITAMIN D  25 Hydroxy (Vit-D Deficiency, Fractures)  Benign prostatic hyperplasia without lower urinary tract symptoms Assessment & Plan: Continue f/u with urology and taking medications as directed   Herpes zoster vaccination declined Assessment & Plan: Declines shingrix, educated on disease process and is aware if he changes his mind to notify office    Other long term (current) drug therapy -     CBC with Differential/Platelet     Return for 1 year physical, 6 month chol check. Patient was given opportunity to ask questions. Patient verbalized understanding of the plan and was able to repeat key elements of the plan. All questions were answered to their satisfaction.   [name], FNP  I, [name], FNP, have reviewed all documentation for this visit. The documentation on 07/30/23 for the exam, diagnosis, procedures, and orders are all accurate and complete.

## 2023-07-30 NOTE — Assessment & Plan Note (Signed)
 Will check vitamin D level and supplement as needed.    Also encouraged to spend 15 minutes in the sun daily.

## 2023-07-30 NOTE — Assessment & Plan Note (Signed)
 Cholesterol levels are normal, continue statin, tolerating well.

## 2023-07-30 NOTE — Assessment & Plan Note (Signed)
 Behavior modifications discussed and diet history reviewed.   Pt will continue to exercise regularly and modify diet with low GI, plant based foods and decrease intake of processed foods.  Recommend intake of daily multivitamin, Vitamin D, and calcium.  Recommend for preventive screenings, as well as recommend immunizations that include influenza, TDAP, and Shingles

## 2023-07-31 ENCOUNTER — Encounter: Payer: Medicare Other | Admitting: Nurse Practitioner

## 2023-07-31 LAB — CMP14+EGFR
ALT: 22 IU/L (ref 0–44)
AST: 24 IU/L (ref 0–40)
Albumin: 4.3 g/dL (ref 3.7–4.7)
Alkaline Phosphatase: 78 IU/L (ref 44–121)
BUN/Creatinine Ratio: 10 (ref 10–24)
BUN: 10 mg/dL (ref 8–27)
Bilirubin Total: 0.7 mg/dL (ref 0.0–1.2)
CO2: 22 mmol/L (ref 20–29)
Calcium: 9.8 mg/dL (ref 8.6–10.2)
Chloride: 101 mmol/L (ref 96–106)
Creatinine, Ser: 1.04 mg/dL (ref 0.76–1.27)
Globulin, Total: 2.7 g/dL (ref 1.5–4.5)
Glucose: 90 mg/dL (ref 70–99)
Potassium: 4.2 mmol/L (ref 3.5–5.2)
Sodium: 138 mmol/L (ref 134–144)
Total Protein: 7 g/dL (ref 6.0–8.5)
eGFR: 70 mL/min/1.73 (ref >59)

## 2023-07-31 LAB — CBC WITH DIFFERENTIAL/PLATELET
Basophils Absolute: 0 x10E3/uL (ref 0.0–0.2)
Basos: 1 %
EOS (ABSOLUTE): 0.2 x10E3/uL (ref 0.0–0.4)
Eos: 3 %
Hematocrit: 40.9 % (ref 37.5–51.0)
Hemoglobin: 13.1 g/dL (ref 13.0–17.7)
Immature Grans (Abs): 0 x10E3/uL (ref 0.0–0.1)
Immature Granulocytes: 0 %
Lymphocytes Absolute: 2.4 x10E3/uL (ref 0.7–3.1)
Lymphs: 40 %
MCH: 32.3 pg (ref 26.6–33.0)
MCHC: 32 g/dL (ref 31.5–35.7)
MCV: 101 fL — ABNORMAL HIGH (ref 79–97)
Monocytes Absolute: 0.5 x10E3/uL (ref 0.1–0.9)
Monocytes: 9 %
Neutrophils Absolute: 2.8 x10E3/uL (ref 1.4–7.0)
Neutrophils: 47 %
Platelets: 194 x10E3/uL (ref 150–450)
RBC: 4.05 x10E6/uL — ABNORMAL LOW (ref 4.14–5.80)
RDW: 11.1 % — ABNORMAL LOW (ref 11.6–15.4)
WBC: 5.9 x10E3/uL (ref 3.4–10.8)

## 2023-07-31 LAB — LIPID PANEL
Chol/HDL Ratio: 4.1 ratio (ref 0.0–5.0)
Cholesterol, Total: 171 mg/dL (ref 100–199)
HDL: 42 mg/dL (ref >39)
LDL Chol Calc (NIH): 109 mg/dL — ABNORMAL HIGH (ref 0–99)
Triglycerides: 109 mg/dL (ref 0–149)
VLDL Cholesterol Cal: 20 mg/dL (ref 5–40)

## 2023-07-31 LAB — TSH+FREE T4
Free T4: 1.55 ng/dL (ref 0.82–1.77)
TSH: 0.429 u[IU]/mL — ABNORMAL LOW (ref 0.450–4.500)

## 2023-07-31 LAB — VITAMIN D 25 HYDROXY (VIT D DEFICIENCY, FRACTURES): Vit D, 25-Hydroxy: 39.1 ng/mL (ref 30.0–100.0)

## 2023-08-12 ENCOUNTER — Ambulatory Visit: Payer: Self-pay | Admitting: Nurse Practitioner

## 2023-08-12 DIAGNOSIS — E039 Hypothyroidism, unspecified: Secondary | ICD-10-CM

## 2023-09-11 ENCOUNTER — Other Ambulatory Visit: Payer: Self-pay

## 2023-09-11 DIAGNOSIS — E039 Hypothyroidism, unspecified: Secondary | ICD-10-CM

## 2023-09-12 ENCOUNTER — Ambulatory Visit: Payer: Self-pay | Admitting: Nurse Practitioner

## 2023-09-12 LAB — TSH+FREE T4
Free T4: 0.69 ng/dL — ABNORMAL LOW (ref 0.82–1.77)
TSH: 16.3 u[IU]/mL — ABNORMAL HIGH (ref 0.450–4.500)

## 2023-09-16 ENCOUNTER — Other Ambulatory Visit: Payer: Self-pay | Admitting: Urology

## 2023-11-26 ENCOUNTER — Other Ambulatory Visit

## 2023-11-26 ENCOUNTER — Other Ambulatory Visit: Payer: Self-pay

## 2023-11-26 DIAGNOSIS — E039 Hypothyroidism, unspecified: Secondary | ICD-10-CM

## 2023-11-27 LAB — TSH+FREE T4
Free T4: 0.49 ng/dL — ABNORMAL LOW (ref 0.82–1.77)
TSH: 48.3 u[IU]/mL — ABNORMAL HIGH (ref 0.450–4.500)

## 2024-01-14 ENCOUNTER — Other Ambulatory Visit: Payer: Self-pay | Admitting: Nurse Practitioner

## 2024-01-14 DIAGNOSIS — E782 Mixed hyperlipidemia: Secondary | ICD-10-CM

## 2024-01-30 ENCOUNTER — Encounter: Payer: Self-pay | Admitting: Nurse Practitioner

## 2024-01-30 ENCOUNTER — Ambulatory Visit: Payer: Self-pay | Admitting: Nurse Practitioner

## 2024-01-30 VITALS — BP 120/70 | HR 57 | Temp 98.7°F | Ht 66.0 in | Wt 173.4 lb

## 2024-01-30 DIAGNOSIS — E663 Overweight: Secondary | ICD-10-CM | POA: Diagnosis not present

## 2024-01-30 DIAGNOSIS — E039 Hypothyroidism, unspecified: Secondary | ICD-10-CM

## 2024-01-30 DIAGNOSIS — E782 Mixed hyperlipidemia: Secondary | ICD-10-CM

## 2024-01-30 DIAGNOSIS — Z6827 Body mass index (BMI) 27.0-27.9, adult: Secondary | ICD-10-CM | POA: Diagnosis not present

## 2024-01-30 DIAGNOSIS — Z2821 Immunization not carried out because of patient refusal: Secondary | ICD-10-CM

## 2024-01-30 NOTE — Progress Notes (Signed)
 LILLETTE Kristeen JINNY Gladis, CMA,acting as a neurosurgeon for [name], FNP.,have documented all relevant documentation on the behalf of [name], FNP,as directed by  [name], FNP while in the presence of [name], FNP.  Subjective:  Patient ID: Samuel [name] , male    DOB: September 03, 1935 , 89 y.o.   MRN: [id number]  Chief Complaint  Patient presents with   Hyperlipidemia    Patient presents today for a CHOL and TSH follow up, Patient reports compliance with medication. Patient denies any chest pain, SOB, or headaches. Patient reports he feels like his thyroid  medication needs to be adjusted due to weight gain, he would like to know if he needs to see endo.      Here for follow up of thyroid  and cholesterol. He tells me he has been taking his thyroid  medications only on Saturday and Sunday. He was to take it a while back Mon-Fri and hold on Sat and Sun. He has noticed his weight increased particularly to the midsection. Denies difficulty with swallowing.       Past Medical History:  Diagnosis Date   Hyperlipidemia    Hypothyroid      History reviewed. No pertinent family history.  Current Medications[1]   Allergies[2]   Review of Systems  Constitutional:  Positive for unexpected weight change (increase in weight).  HENT: Negative.    Eyes: Negative.   Respiratory: Negative.    Cardiovascular: Negative.   Gastrointestinal: Negative.   Neurological: Negative.   Psychiatric/Behavioral: Negative.       Today's Vitals   01/30/24 1108  BP: 120/70  Pulse: (!) 57  Temp: 98.7 F (37.1 C)  TempSrc: Oral  Weight: 173 lb 6.4 oz (78.7 kg)  Height: 5' 6 (1.676 m)  PainSc: 0-No pain   Body mass index is 27.99 kg/m.  Wt Readings from Last 3 Encounters:  01/30/24 173 lb 6.4 oz (78.7 kg)  07/30/23 161 lb 6.4 oz (73.2 kg)  04/04/23 160 lb 6.4 oz (72.8 kg)     Objective:  Physical Exam Vitals and nursing note reviewed.  Constitutional:      General: He is not in acute  distress.    Appearance: Normal appearance.  Cardiovascular:     Rate and Rhythm: Normal rate and regular rhythm.     Pulses: Normal pulses.     Heart sounds: Normal heart sounds. No murmur heard. Pulmonary:     Effort: Pulmonary effort is normal. No respiratory distress.     Breath sounds: Normal breath sounds. No wheezing.  Skin:    General: Skin is warm and dry.     Capillary Refill: Capillary refill takes less than 2 seconds.  Neurological:     General: No focal deficit present.     Mental Status: He is alert and oriented to person, place, and time. Mental status is at baseline.     Cranial Nerves: No cranial nerve deficit.     Motor: No weakness.  Psychiatric:        Mood and Affect: Mood normal.        Behavior: Behavior normal.        Thought Content: Thought content normal.        Judgment: Judgment normal.         Assessment And Plan:   Assessment & Plan Mixed hyperlipidemia Cholesterol levels are normal, continue statin, tolerating well. Acquired hypothyroidism Thyroid  levels are normal. Continue medications, refill sent. Will recheck thyroid  levels.  Herpes zoster vaccination declined  Overweight  with body mass index (BMI) of 27 to 27.9 in adult   Orders Placed This Encounter  Procedures   Lipid panel   TSH + free T4     Return for keep same next.  Patient was given opportunity to ask questions. Patient verbalized understanding of the plan and was able to repeat key elements of the plan. All questions were answered to their satisfaction.    LILLETTE [name], FNP, have reviewed all documentation for this visit. The documentation on 01/30/24 for the exam, diagnosis, procedures, and orders are all accurate and complete.   IF YOU HAVE BEEN REFERRED TO A SPECIALIST, IT MAY TAKE 1-2 WEEKS TO SCHEDULE/PROCESS THE REFERRAL. IF YOU HAVE NOT HEARD FROM US /SPECIALIST IN TWO WEEKS, PLEASE GIVE US  A CALL AT [phone].      [1]  Current Outpatient  Medications:    atorvastatin  (LIPITOR) 20 MG tablet, TAKE 1 TABLET BY MOUTH EVERY DAY, Disp: 90 tablet, Rfl: 1   cholecalciferol (VITAMIN D3) 25 MCG (1000 UT) tablet, Take 1,000 Units by mouth daily. Take 3000 units daily, Disp: , Rfl:    dutasteride (AVODART) 0.5 MG capsule, TAKE 1 CAPSULE BY MOUTH EVERY DAY, Disp: 90 capsule, Rfl: 3   tadalafil (CIALIS) 5 MG tablet, TAKE 1 TABLET BY MOUTH EVERY DAY, Disp: 30 tablet, Rfl: 11 [2] No Known Allergies

## 2024-01-31 ENCOUNTER — Ambulatory Visit: Payer: Self-pay | Admitting: Nurse Practitioner

## 2024-01-31 DIAGNOSIS — E039 Hypothyroidism, unspecified: Secondary | ICD-10-CM

## 2024-01-31 LAB — LIPID PANEL
Chol/HDL Ratio: 3.9 ratio (ref 0.0–5.0)
Cholesterol, Total: 193 mg/dL (ref 100–199)
HDL: 49 mg/dL
LDL Chol Calc (NIH): 125 mg/dL — ABNORMAL HIGH (ref 0–99)
Triglycerides: 104 mg/dL (ref 0–149)
VLDL Cholesterol Cal: 19 mg/dL (ref 5–40)

## 2024-01-31 LAB — TSH+FREE T4
Free T4: 0.44 ng/dL — ABNORMAL LOW (ref 0.82–1.77)
TSH: 54.7 u[IU]/mL — ABNORMAL HIGH (ref 0.450–4.500)

## 2024-01-31 MED ORDER — LEVOTHYROXINE SODIUM 100 MCG PO TABS: 100.0000 ug | ORAL_TABLET | Freq: Every day | ORAL | 1 refills | Status: DC

## 2024-02-10 MED ORDER — LEVOTHYROXINE SODIUM 50 MCG PO TABS: 50.0000 ug | ORAL_TABLET | Freq: Every day | ORAL | 1 refills | Status: AC

## 2024-02-10 MED ORDER — LEVOTHYROXINE SODIUM 100 MCG PO TABS: 100.0000 ug | ORAL_TABLET | Freq: Every day | ORAL | 1 refills | Status: DC

## 2024-02-10 NOTE — Assessment & Plan Note (Addendum)
 Thyroid  levels are normal. Continue medications, refill sent. Will recheck thyroid  levels.

## 2024-02-10 NOTE — Assessment & Plan Note (Addendum)
 Cholesterol levels are normal, continue statin, tolerating well.

## 2024-05-14 ENCOUNTER — Ambulatory Visit: Payer: Self-pay

## 2024-07-30 ENCOUNTER — Encounter: Payer: Self-pay | Admitting: Nurse Practitioner
